# Patient Record
Sex: Male | Born: 1955 | Hispanic: Yes | Marital: Married | Smoking: Never smoker
Health system: Southern US, Community
[De-identification: ages and names within clinical notes are randomized; demographics above are authoritative.]

## PROBLEM LIST (undated history)

## (undated) DIAGNOSIS — E119 Type 2 diabetes mellitus without complications: Secondary | ICD-10-CM

## (undated) DIAGNOSIS — N200 Calculus of kidney: Secondary | ICD-10-CM

## (undated) DIAGNOSIS — Z87442 Personal history of urinary calculi: Secondary | ICD-10-CM

## (undated) HISTORY — PX: APPENDECTOMY: SHX54

## (undated) HISTORY — PX: COLONOSCOPY: SHX174

---

## 2012-07-09 ENCOUNTER — Ambulatory Visit
Admission: RE | Admit: 2012-07-09 | Discharge: 2012-07-09 | Disposition: A | Discharge status: Home / Self Care | Payer: BC Managed Care – PPO | Source: Ambulatory Visit | Attending: Internal Medicine | Admitting: Internal Medicine

## 2012-07-09 ENCOUNTER — Other Ambulatory Visit: Payer: Self-pay | Admitting: Internal Medicine

## 2012-07-09 DIAGNOSIS — R319 Hematuria, unspecified: Secondary | ICD-10-CM

## 2012-07-09 DIAGNOSIS — M545 Low back pain: Secondary | ICD-10-CM

## 2012-09-06 ENCOUNTER — Emergency Department (HOSPITAL_COMMUNITY)
Admission: EM | Admit: 2012-09-06 | Discharge: 2012-09-06 | Disposition: A | Discharge status: Home / Self Care | Payer: BC Managed Care – PPO | Attending: Emergency Medicine | Admitting: Emergency Medicine

## 2012-09-06 ENCOUNTER — Emergency Department (HOSPITAL_COMMUNITY): Payer: BC Managed Care – PPO

## 2012-09-06 ENCOUNTER — Encounter (HOSPITAL_COMMUNITY): Payer: Self-pay | Admitting: *Deleted

## 2012-09-06 DIAGNOSIS — R7309 Other abnormal glucose: Secondary | ICD-10-CM | POA: Insufficient documentation

## 2012-09-06 DIAGNOSIS — N200 Calculus of kidney: Secondary | ICD-10-CM | POA: Insufficient documentation

## 2012-09-06 DIAGNOSIS — Z79899 Other long term (current) drug therapy: Secondary | ICD-10-CM | POA: Insufficient documentation

## 2012-09-06 DIAGNOSIS — R739 Hyperglycemia, unspecified: Secondary | ICD-10-CM

## 2012-09-06 DIAGNOSIS — Z9089 Acquired absence of other organs: Secondary | ICD-10-CM | POA: Insufficient documentation

## 2012-09-06 HISTORY — DX: Calculus of kidney: N20.0

## 2012-09-06 LAB — URINALYSIS, ROUTINE W REFLEX MICROSCOPIC
Bilirubin Urine: NEGATIVE
Protein, ur: 100 mg/dL — AB
Urobilinogen, UA: 1 mg/dL (ref 0.0–1.0)

## 2012-09-06 LAB — POCT I-STAT, CHEM 8
Creatinine, Ser: 0.8 mg/dL (ref 0.50–1.35)
Hemoglobin: 18 g/dL — ABNORMAL HIGH (ref 13.0–17.0)
Potassium: 4.4 mEq/L (ref 3.5–5.1)
Sodium: 140 mEq/L (ref 135–145)
TCO2: 30 mmol/L (ref 0–100)

## 2012-09-06 MED ORDER — HYDROMORPHONE HCL PF 1 MG/ML IJ SOLN
1.0000 mg | Freq: Once | INTRAMUSCULAR | Status: AC
  Administered 2012-09-06: 1 mg via INTRAVENOUS
  Filled 2012-09-06: qty 1

## 2012-09-06 MED ORDER — OXYCODONE-ACETAMINOPHEN 5-325 MG PO TABS: 2.0000 | ORAL_TABLET | ORAL | Status: DC | PRN

## 2012-09-06 MED ORDER — KETOROLAC TROMETHAMINE 30 MG/ML IJ SOLN
30.0000 mg | Freq: Once | INTRAMUSCULAR | Status: AC
  Administered 2012-09-06: 30 mg via INTRAVENOUS
  Filled 2012-09-06: qty 1

## 2012-09-06 MED ORDER — SODIUM CHLORIDE 0.9 % IV BOLUS (SEPSIS)
1000.0000 mL | Freq: Once | INTRAVENOUS | Status: AC
  Administered 2012-09-06: 1000 mL via INTRAVENOUS

## 2012-09-06 MED ORDER — ONDANSETRON HCL 8 MG PO TABS: 8.0000 mg | ORAL_TABLET | Freq: Three times a day (TID) | ORAL | Status: DC | PRN

## 2012-09-06 MED ORDER — ONDANSETRON HCL 4 MG/2ML IJ SOLN
4.0000 mg | Freq: Once | INTRAMUSCULAR | Status: AC
  Administered 2012-09-06: 4 mg via INTRAVENOUS
  Filled 2012-09-06: qty 2

## 2012-09-06 NOTE — ED Notes (Signed)
Pt previous history of kidney stone that hasn't passed yet; pt c/o lower abd pain starting yesterday and right flank pain; worse today; nausea no vomiting

## 2012-09-06 NOTE — ED Provider Notes (Signed)
History     CSN: [id number]  Arrival date & time 09/06/12  1308   First MD Initiated Contact with Patient 09/06/12 2013      Chief Complaint  Patient presents with  . Flank Pain    (Consider location/radiation/quality/duration/timing/severity/associated sxs/prior treatment) HPI History provided by pt.   Pt presents w/ c/o severe, constant, non-pleuritic, suprapubic pain w/ radiation to right flank since early this afternoon.  No relief w/ vicodin and flomax.  Associated w/ nausea and dysuria.  Denies fever, SOB, testicular pain, change in bowels.  H/o kidney stone in December.  Seen by [facility].  Per prior chart, CT showed 6mm ureteral stone at L5.  Pt has not had pain associated w/ this stone in 1 month.  Past abd surgeries include appendectomy.  Past Medical History  Diagnosis Date  . Kidney stone     Past Surgical History  Procedure Date  . Appendectomy     No family history on file.  History  Substance Use Topics  . Smoking status: Never Smoker   . Smokeless tobacco: Not on file  . Alcohol Use: No      Review of Systems  All other systems reviewed and are negative.    Allergies  Adhesive  Home Medications   Current Outpatient Rx  Name  Route  Sig  Dispense  Refill  . ESOMEPRAZOLE MAGNESIUM 40 MG PO CPDR   Oral   Take 40 mg by mouth daily as needed. For acid reflux         . HYDROCODONE-ACETAMINOPHEN 10-325 MG PO TABS   Oral   Take 1 tablet by mouth every 6 (six) hours as needed. For pain         . TAMSULOSIN HCL 0.4 MG PO CAPS   Oral   Take 0.4 mg by mouth daily.           BP 159/87  Pulse 69  Temp 98.1 F (36.7 C) (Oral)  Resp 16  SpO2 100%  Physical Exam  Nursing note and vitals reviewed. Constitutional: He is oriented to person, place, and time. He appears well-developed and well-nourished. No distress.  HENT:  Head: Normocephalic and atraumatic.  Eyes:       Normal appearance  Neck: Normal range of motion.    Cardiovascular: Normal rate and regular rhythm.   Pulmonary/Chest: Effort normal and breath sounds normal. No respiratory distress.       No pleuritic pain reported  Abdominal: Soft. Bowel sounds are normal. He exhibits no distension and no mass. There is no rebound and no guarding.       Mod tenderness epigastrium, suprapubic and RLQ.    Genitourinary:       R CVA tenderness  Musculoskeletal: Normal range of motion.  Neurological: He is alert and oriented to person, place, and time.  Skin: Skin is warm and dry. No rash noted.  Psychiatric: He has a normal mood and affect. His behavior is normal.    ED Course  Procedures (including critical care time)  Labs Reviewed  URINALYSIS, ROUTINE W REFLEX MICROSCOPIC - Abnormal; Notable for the following:    Color, Urine AMBER (*)  BIOCHEMICALS MAY BE AFFECTED BY COLOR   APPearance TURBID (*)     Glucose, UA 250 (*)     Hgb urine dipstick LARGE (*)     Ketones, ur TRACE (*)     Protein, ur 100 (*)     All other components within normal limits  POCT I-STAT,  CHEM 8 - Abnormal; Notable for the following:    Glucose, Bld 203 (*)     Hemoglobin 18.0 (*)     HCT 53.0 (*)     All other components within normal limits  URINE MICROSCOPIC-ADD ON   Ct Abdomen Pelvis Wo Contrast  09/06/2012  *RADIOLOGY REPORT*  Clinical Data: Right flank and lower abdominal pain.  Nausea. History urinary tract stones.  CT ABDOMEN AND PELVIS WITHOUT CONTRAST  Technique:  Multidetector CT imaging of the abdomen and pelvis was performed following the standard protocol without intravenous contrast.  Comparison: CT abdomen and pelvis 07/09/2012.  Findings: Lung bases are clear.  No pleural or pericardial effusion.  The patient has extensive stranding about the right kidney and ureter and mild to moderate right hydronephrosis.  The previously identified stone in the mid right ureter has progressed and is now at the right ureterovesicle junction measuring 0.5 cm.  No other  urinary tract stones are identified. Low attenuating lesion in the lower pole of the left kidney consistent with a cyst is again identified.  The liver is low attenuating consistent with fatty infiltration. No focal liver lesion.  A few small gallstones are identified without CT evidence of cholecystitis.  The spleen, adrenal glands and pancreas appear normal. There are some colonic diverticula without diverticulitis.  The colon otherwise appears normal.  The stomach and small bowel are unremarkable.  No focal bony abnormality is identified.  IMPRESSION:  1.  Mid right ureteral stone seen on prior study has progressed and is now at the right UVJ.  There is mild to moderate right hydronephrosis with extensive stranding about the right kidney and ureter. 2.  Fatty infiltration of the liver. 3.  Gallstones without evidence of cholecystitis. 4.  Diverticulosis without diverticulitis.   Original Report Authenticated By: Holley Dexter, M.D.      1. Nephrolithiasis   2. Hyperglycemia       MDM  57yo healthy M w/ h/o kidney stone presents w/ severe suprapubic and right flank pain since early this afternoon.  Pain feels different than past stone.  On exam, afebrile, uncomfortable but non-toxic appearing, right CVA and suprapubic ttp.  Chem 8, U/A and CT abd/pelvis pending.  Pt to receive dilaudid, toradol, zofran and IVF.  Will reassess shortly.  9:04 PM   CT shows 6mm UVJ stone.  Cr w/in nml range and U/A neg for infection.  Results discussed w/ pt.  His pain and nausea are controlled and he is tolerating pos.  Prescribed percocet and zofran;  he has flomax at home.  He will f/u with his urologist.  Return precautions discussed. 11:03 PM         Otilio Miu, PA-C 09/06/12 2303

## 2012-09-07 NOTE — ED Provider Notes (Signed)
Medical screening examination/treatment/procedure(s) were performed by non-physician practitioner and as supervising physician I was immediately available for consultation/collaboration.  Tobin Chad, MD 09/07/12 0040

## 2013-11-19 IMAGING — CT CT ABD-PELV W/O CM
3 of 4 series · 13 of 36 positions shown, 19 images · non-contrast
Comparison: None

CLINICAL DATA: Lower back pain.  Hematuria.  Question of stones.
Prior appendectomy.

CT ABDOMEN AND PELVIS WITHOUT CONTRAST
TECHNIQUE: Multidetector CT imaging of the abdomen and pelvis was
performed following the standard protocol without intravenous
contrast.

[Series 3: renal stone · axial · 0.72mm/px · z∈[-339,-49]mm · 8 of 76 slices shown, 13 images]
[im 9/76  soft-tissue]
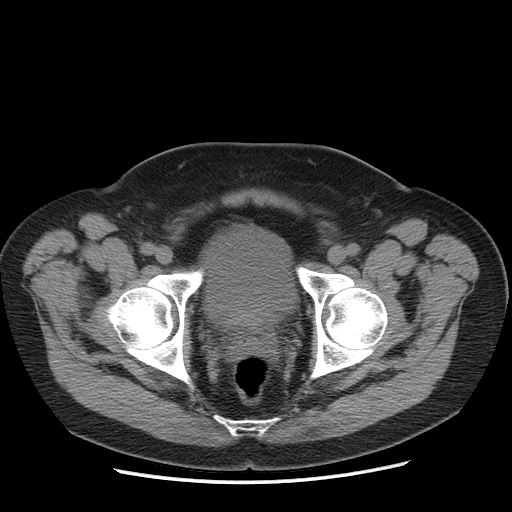
[im 9/76  bone]
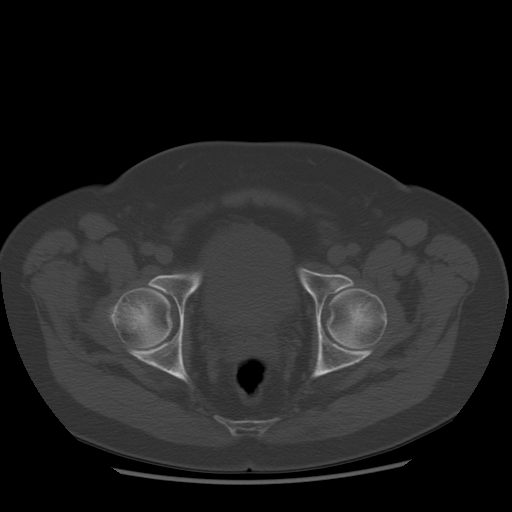
[im 17/76  soft-tissue]
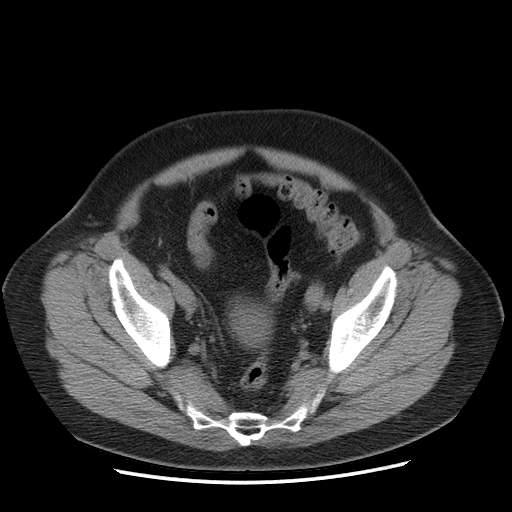
[im 26/76  soft-tissue]
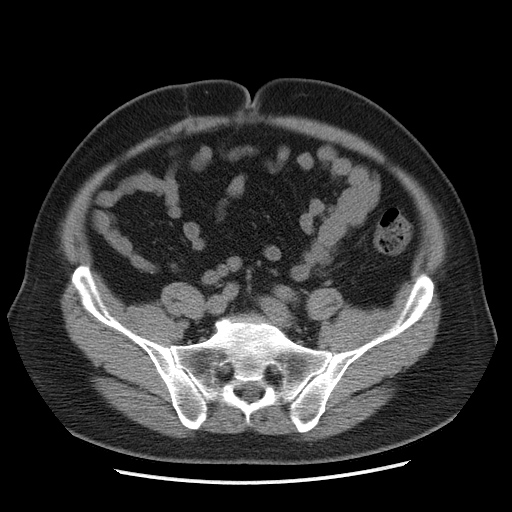
[im 34/76  soft-tissue]
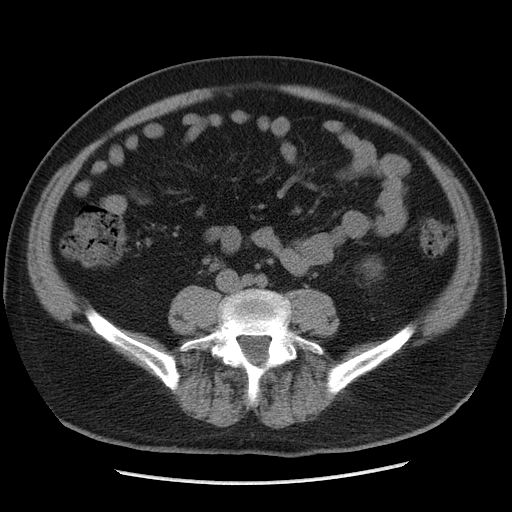
[im 42/76  soft-tissue]
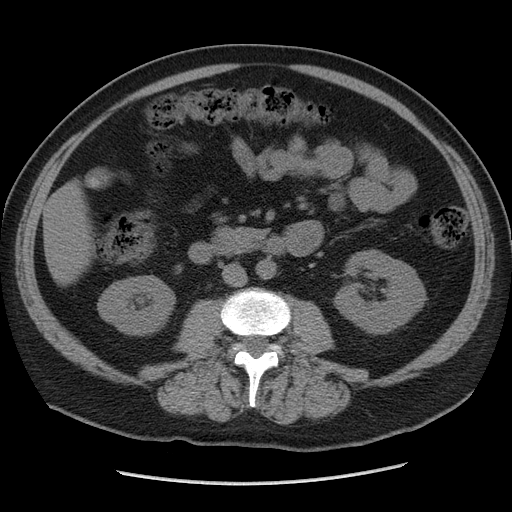
[im 42/76  lung]
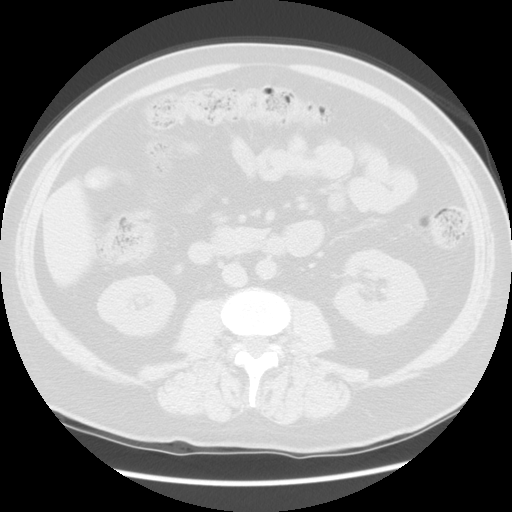
[im 51/76  soft-tissue]
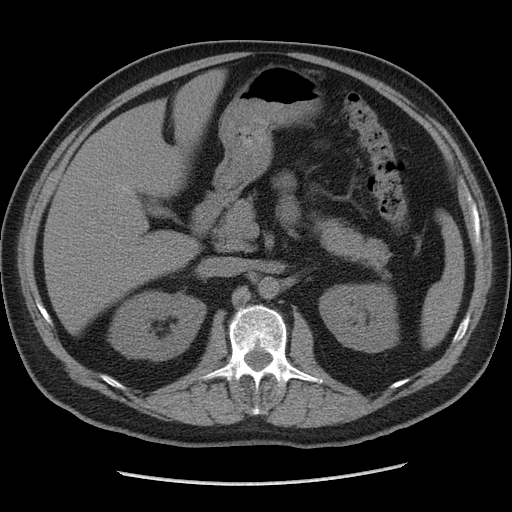
[im 51/76  lung]
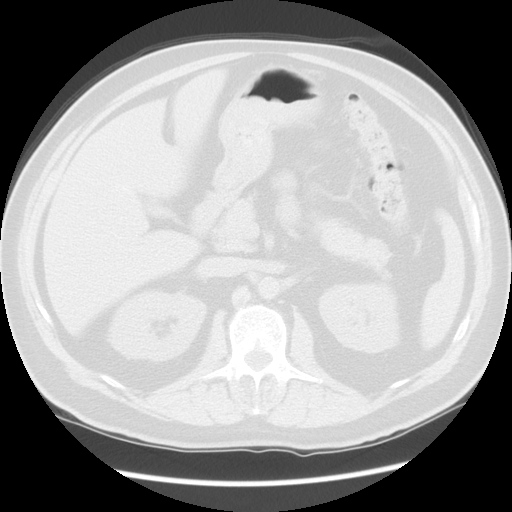
[im 59/76  soft-tissue]
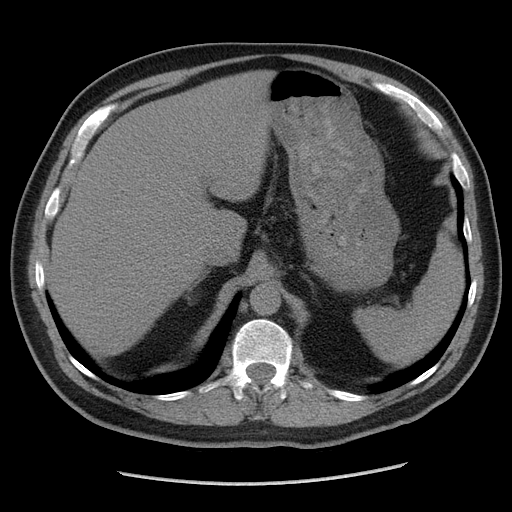
[im 59/76  lung]
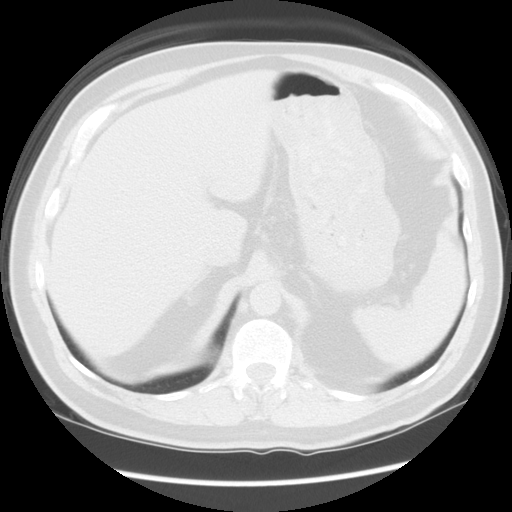
[im 67/76  soft-tissue]
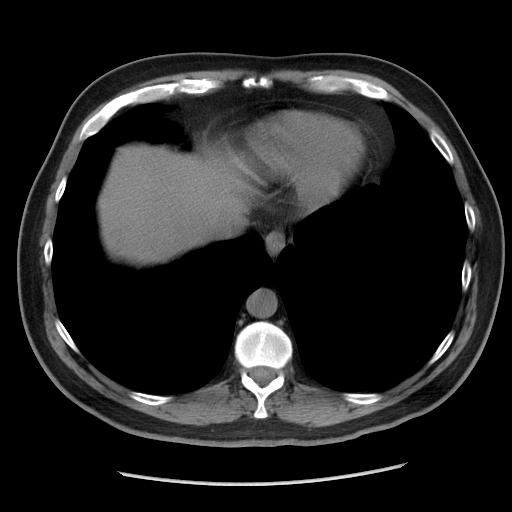
[im 67/76  lung]
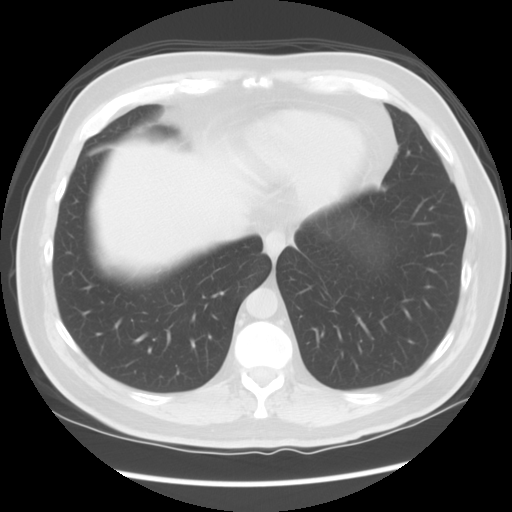

[Series 601: coronal body · coronal · 0.82mm/px · 1 of 127 slices shown, 2 images]
[im 43/127  soft-tissue]
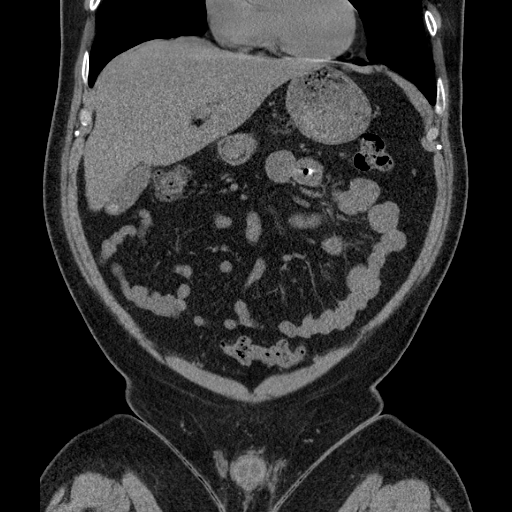
[im 43/127  bone]
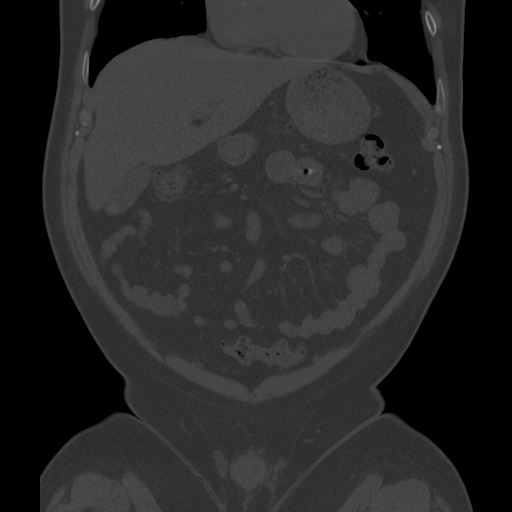

[Series 602: sagittal body · sagittal · 0.82mm/px · 4 of 149 slices shown]
[im 17/149  soft-tissue]
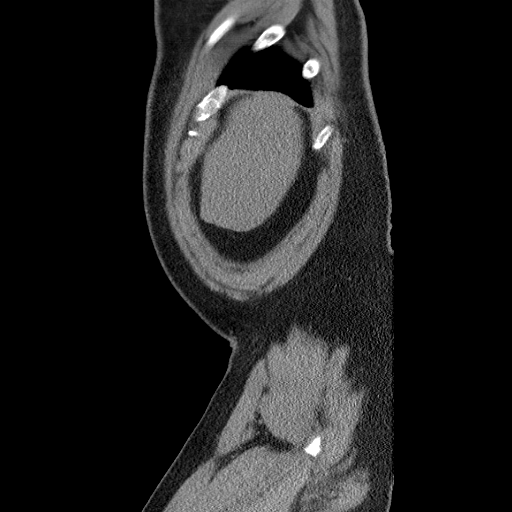
[im 33/149  soft-tissue]
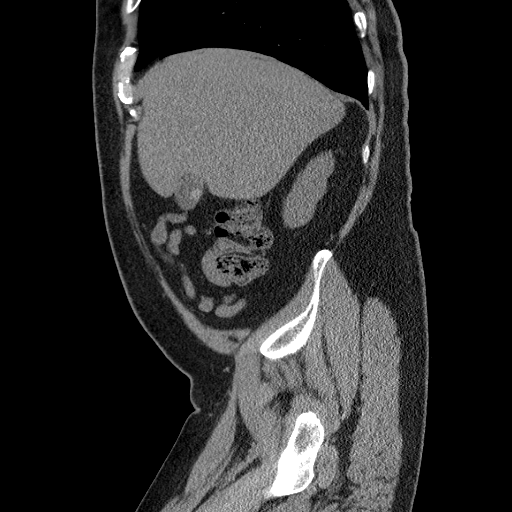
[im 50/149  soft-tissue]
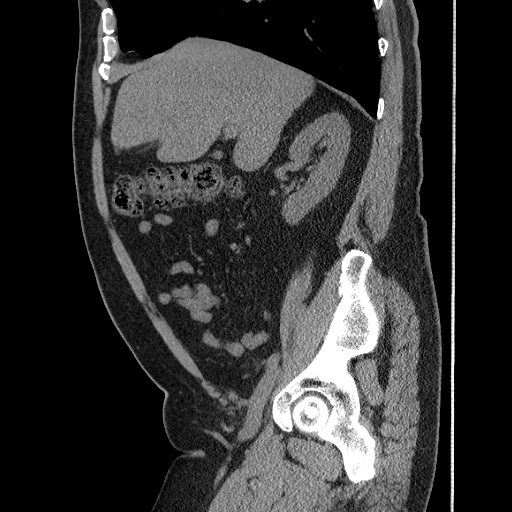
[im 66/149  soft-tissue]
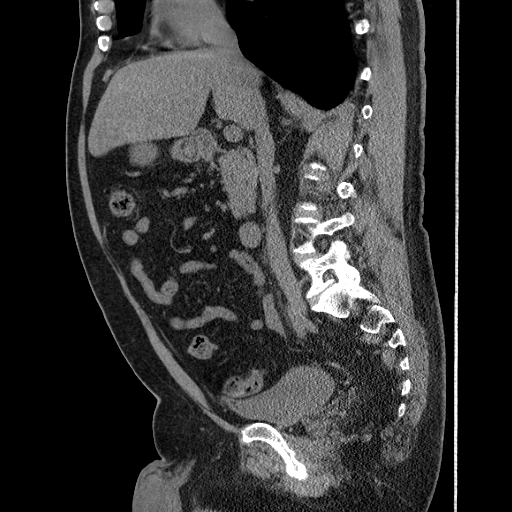

[13 of 36 positions shown; findings below may reference images not displayed]

FINDINGS: Images of the lung bases are unremarkable.  No focal
abnormality identified within the liver, spleen, pancreas, adrenal
glands, or right kidney.  A right proximal ureteral stone measures
6 mm, at the level of L5.  A lower pole lesion is identified in the
left kidney, measuring 2.5 cm.  This is low attenuation but not
fully characterized given the lack of intravenous contrast.
Statistically, this likely represents a cyst.  No left ureteral
stones are identified.

The gallbladder is present contains high attenuation foci,
consistent with stones.  No pericholecystic fluid or stranding
identified.

The stomach and small bowel loops are normal in appearance.  The
appendix is surgically absent.  Colonic loops are normal in
appearance.

No free pelvic fluid or pelvic adenopathy. No evidence for aortic
aneurysm. Visualized osseous structures have a normal appearance.
IMPRESSION: 1.  6 mm right ureteral stone, at the level of L5.
2.  Probable left renal cyst.
3.  Surgically absent appendix.

## 2016-10-31 ENCOUNTER — Ambulatory Visit (INDEPENDENT_AMBULATORY_CARE_PROVIDER_SITE_OTHER): Payer: Self-pay | Admitting: Orthopedic Surgery

## 2016-11-06 ENCOUNTER — Ambulatory Visit (INDEPENDENT_AMBULATORY_CARE_PROVIDER_SITE_OTHER): Payer: Self-pay | Admitting: Orthopedic Surgery

## 2016-11-07 ENCOUNTER — Ambulatory Visit (INDEPENDENT_AMBULATORY_CARE_PROVIDER_SITE_OTHER): Payer: Self-pay | Admitting: Orthopedic Surgery

## 2016-11-19 ENCOUNTER — Ambulatory Visit (INDEPENDENT_AMBULATORY_CARE_PROVIDER_SITE_OTHER): Payer: 59 | Admitting: Orthopedic Surgery

## 2016-11-19 ENCOUNTER — Encounter (INDEPENDENT_AMBULATORY_CARE_PROVIDER_SITE_OTHER): Payer: Self-pay | Admitting: Orthopedic Surgery

## 2016-11-19 DIAGNOSIS — M25511 Pain in right shoulder: Secondary | ICD-10-CM

## 2016-11-19 MED ORDER — TRAMADOL HCL 50 MG PO TABS
50.0000 mg | ORAL_TABLET | Freq: Two times a day (BID) | ORAL | 0 refills | Status: DC | PRN
Start: 2016-11-19 — End: 2021-01-25

## 2016-11-19 NOTE — Progress Notes (Signed)
Office Visit Note   Patient: Antonio Jackson           Date of Birth: Sep 21, 1955           MRN: [id number] Visit Date: 11/19/2016 Requested by: No referring provider defined for this encounter. PCP: Pcp Not In System  Subjective: Chief Complaint  Patient presents with  . Right Shoulder - Pain    HPI: Patient is a 61 year old with right shoulder pain.  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  Reports a burning pain around the deltoid.  It does wake him from sleep at night.  Getting worse over 3 months but it's has been presents for slightly longer.  Prednisone dosepak gave him temporary relief only.  He noticed some restriction of motion when he was swimming this summer.  He denies any neck symptoms.  Denies any history of injury.              ROS: All systems reviewed are negative as they relate to the chief complaint within the history of present illness.  Patient denies  fevers or chills.   Assessment & Plan: Visit Diagnoses:  1. Acute pain of right shoulder     Plan: Impression is right frozen shoulder.  He has fairly significant restriction of passive range of motion.  Radiographs not done today per patient request.  Discussed with him natural history of frozen shoulder.  He's going to try exercise tramadol to take at night as well as physical therapy.  We'll give him a shot in the shoulder in 6 weeks if he is no better.  I'll see him back at that time for repeat clinical assessment in clinical decision making regarding injection.  Follow-Up Instructions: Return in about 6 weeks (around 12/31/2016).   Orders:  No orders of the defined types were placed in this encounter.  No orders of the defined types were placed in this encounter.     Procedures: No procedures performed   Clinical Data: No additional findings.  Objective: Vital Signs: There were no vitals taken for this visit.  Physical Exam:   Constitutional: Patient appears  well-developed HEENT:  Head: Normocephalic Eyes:EOM are normal Neck: Normal range of motion Cardiovascular: Normal rate Pulmonary/chest: Effort normal Neurologic: Patient is alert Skin: Skin is warm Psychiatric: Patient has normal mood and affect    Ortho Exam: Orthopedic exam demonstrates full active and passive range of motion of the left shoulder.  On the right shoulder he has external rotation at 15 abduction to about 30 compared to 60 on the left.  Isolated [place] flexion less than 90 on the right 180 on the left.  Isolated glenohumeral abduction again is around 70 on the right-hand side 120 on the left.  Rotator cuff strength is intact on the right-hand side to isolated infraspinatus supraspinatus and subscap muscle testing.  No other masses lymph adenopathy or skin changes noted in the shoulder region  Specialty Comments:  No specialty comments available.  Imaging: No results found.   PMFS History: There are no active problems to display for this patient.  Past Medical History:  Diagnosis Date  . Kidney stone     No family history on file.  Past Surgical History:  Procedure Laterality Date  . APPENDECTOMY     Social History   Occupational History  . Not on file.   Social History Main Topics  . Smoking status: Never Smoker  . Smokeless tobacco: Never Used  . Alcohol use No  .  Drug use: Unknown  . Sexual activity: Not on file

## 2017-01-02 ENCOUNTER — Ambulatory Visit (INDEPENDENT_AMBULATORY_CARE_PROVIDER_SITE_OTHER): Payer: 59 | Admitting: Orthopedic Surgery

## 2017-01-19 ENCOUNTER — Encounter (INDEPENDENT_AMBULATORY_CARE_PROVIDER_SITE_OTHER): Payer: Self-pay | Admitting: Orthopedic Surgery

## 2017-01-19 ENCOUNTER — Ambulatory Visit (INDEPENDENT_AMBULATORY_CARE_PROVIDER_SITE_OTHER): Payer: 59 | Admitting: Orthopedic Surgery

## 2017-01-19 DIAGNOSIS — M25511 Pain in right shoulder: Secondary | ICD-10-CM | POA: Diagnosis not present

## 2017-01-19 NOTE — Progress Notes (Signed)
   Office Visit Note   Patient: Antonio Jackson           Date of Birth: July 27, 1956           MRN: [id number] Visit Date: 01/19/2017 Requested by: No referring provider defined for this encounter. PCP: System, Pcp Not In  Subjective: Chief Complaint  Patient presents with  . Right Shoulder - Follow-up    HPI: Patient is a 61 year old with right frozen shoulder.  Last seen 11/19/2016.  He was doing well but his mother became sick and he had to attend her.  [place]  He can sleep now and in general he is improving.  He is taking ibuprofen.              ROS: All systems reviewed are negative as they relate to the chief complaint within the history of present illness.  Patient denies  fevers or chills.   Assessment & Plan: Visit Diagnoses:  1. Right shoulder pain, unspecified chronicity     Plan: Impression is marginal improvement in right shoulder range of motion.  Plan is continue with home exercise program plus physical therapy 3 times a week for 4-6 weeks.  I think he will continue to improve.  Decision point today was for or against injection.  He wants to hold off on an injection and continue to try to improve on his own.  I'll see him back as needed  Follow-Up Instructions: Return if symptoms worsen or fail to improve.   Orders:  No orders of the defined types were placed in this encounter.  No orders of the defined types were placed in this encounter.     Procedures: No procedures performed   Clinical Data: No additional findings.  Objective: Vital Signs: There were no vitals taken for this visit.  Physical Exam:   Constitutional: Patient appears well-developed HEENT:  Head: Normocephalic Eyes:EOM are normal Neck: Normal range of motion Cardiovascular: Normal rate Pulmonary/chest: Effort normal Neurologic: Patient is alert Skin: Skin is warm Psychiatric: Patient has normal mood and affect    Ortho Exam: Orthopedic exam  demonstrates good cervical spine range of motion good motor sensory function in the right arm external rotation on the right at 15 abduction is 20.  Isolated glenohumeral forward flexion is 100 and isolated glenohumeral abduction is 80.  Rotator cuff strength is intact.  The shoulder does feel more limber that it was 6 weeks ago.  Specialty Comments:  No specialty comments available.  Imaging: No results found.   PMFS History: There are no active problems to display for this patient.  Past Medical History:  Diagnosis Date  . Kidney stone     No family history on file.  Past Surgical History:  Procedure Laterality Date  . APPENDECTOMY     Social History   Occupational History  . Not on file.   Social History Main Topics  . Smoking status: Never Smoker  . Smokeless tobacco: Never Used  . Alcohol use No  . Drug use: Unknown  . Sexual activity: Not on file

## 2018-10-26 ENCOUNTER — Other Ambulatory Visit: Payer: Self-pay | Admitting: Internal Medicine

## 2018-10-26 DIAGNOSIS — R10816 Epigastric abdominal tenderness: Secondary | ICD-10-CM

## 2018-10-27 ENCOUNTER — Other Ambulatory Visit: Payer: Self-pay | Admitting: Internal Medicine

## 2018-10-27 ENCOUNTER — Other Ambulatory Visit: Payer: 59

## 2018-10-27 DIAGNOSIS — R10816 Epigastric abdominal tenderness: Secondary | ICD-10-CM

## 2018-11-01 ENCOUNTER — Other Ambulatory Visit: Payer: Self-pay

## 2018-11-08 ENCOUNTER — Ambulatory Visit
Admission: RE | Admit: 2018-11-08 | Discharge: 2018-11-08 | Disposition: A | Discharge status: Home / Self Care | Payer: 59 | Source: Ambulatory Visit | Attending: Internal Medicine | Admitting: Internal Medicine

## 2018-11-08 ENCOUNTER — Other Ambulatory Visit: Payer: 59

## 2018-11-08 ENCOUNTER — Other Ambulatory Visit: Payer: Self-pay

## 2018-11-08 DIAGNOSIS — R10816 Epigastric abdominal tenderness: Secondary | ICD-10-CM

## 2018-11-08 MED ORDER — IOPAMIDOL (ISOVUE-300) INJECTION 61%
100.0000 mL | Freq: Once | INTRAVENOUS | Status: AC | PRN
  Administered 2018-11-08: 100 mL via INTRAVENOUS

## 2019-10-09 ENCOUNTER — Ambulatory Visit: Payer: 59

## 2019-10-26 ENCOUNTER — Ambulatory Visit: Payer: 59

## 2020-04-10 ENCOUNTER — Other Ambulatory Visit: Payer: Self-pay | Admitting: Internal Medicine

## 2020-04-10 DIAGNOSIS — R1013 Epigastric pain: Secondary | ICD-10-CM

## 2020-04-20 ENCOUNTER — Ambulatory Visit
Admission: RE | Admit: 2020-04-20 | Discharge: 2020-04-20 | Disposition: A | Discharge status: Home / Self Care | Payer: 59 | Source: Ambulatory Visit | Attending: Internal Medicine | Admitting: Internal Medicine

## 2020-04-20 DIAGNOSIS — R1013 Epigastric pain: Secondary | ICD-10-CM

## 2020-06-22 ENCOUNTER — Other Ambulatory Visit: Payer: Self-pay | Admitting: Surgery

## 2020-08-29 ENCOUNTER — Encounter (HOSPITAL_BASED_OUTPATIENT_CLINIC_OR_DEPARTMENT_OTHER): Payer: Self-pay | Admitting: Surgery

## 2020-09-03 ENCOUNTER — Encounter (HOSPITAL_BASED_OUTPATIENT_CLINIC_OR_DEPARTMENT_OTHER)
Admission: RE | Admit: 2020-09-03 | Discharge: 2020-09-03 | Disposition: A | Discharge status: Home / Self Care | Payer: 59 | Source: Ambulatory Visit | Attending: Surgery | Admitting: Surgery

## 2020-09-03 ENCOUNTER — Other Ambulatory Visit (HOSPITAL_COMMUNITY)
Admission: RE | Admit: 2020-09-03 | Discharge: 2020-09-03 | Disposition: A | Discharge status: Home / Self Care | Payer: 59 | Source: Ambulatory Visit | Attending: Surgery | Admitting: Surgery

## 2020-09-03 DIAGNOSIS — Z20822 Contact with and (suspected) exposure to covid-19: Secondary | ICD-10-CM | POA: Insufficient documentation

## 2020-09-03 DIAGNOSIS — Z01818 Encounter for other preprocedural examination: Secondary | ICD-10-CM | POA: Insufficient documentation

## 2020-09-03 LAB — BASIC METABOLIC PANEL
Anion gap: 10 (ref 5–15)
BUN: 8 mg/dL (ref 8–23)
CO2: 24 mmol/L (ref 22–32)
Calcium: 9.2 mg/dL (ref 8.9–10.3)
Chloride: 103 mmol/L (ref 98–111)
Creatinine, Ser: 0.69 mg/dL (ref 0.61–1.24)
GFR, Estimated: >60 mL/min (ref >60)
Glucose, Bld: 161 mg/dL — ABNORMAL HIGH (ref 70–99)
Potassium: 3.8 mmol/L (ref 3.5–5.1)
Sodium: 137 mmol/L (ref 135–145)

## 2020-09-03 MED ORDER — ENSURE PRE-SURGERY PO LIQD: 296.0000 mL | Freq: Once | ORAL | Status: DC

## 2020-09-03 NOTE — Progress Notes (Signed)
EKG reviewed by Dr.Green, will proceed with surgery as scheduled. 

## 2020-09-03 NOTE — Progress Notes (Signed)

## 2020-09-04 LAB — SARS CORONAVIRUS 2 (TAT 6-24 HRS): SARS Coronavirus 2: NEGATIVE

## 2020-09-05 NOTE — Anesthesia Preprocedure Evaluation (Addendum)
Anesthesia Evaluation  Patient identified by MRN, date of birth, ID band Patient awake    Reviewed: Allergy & Precautions, H&P , NPO status , Patient's Chart, lab work & pertinent test results  Airway Mallampati: II  TM Distance: >3 FB Neck ROM: Full    Dental no notable dental hx. (+) Teeth Intact, Dental Advisory Given, Missing   Pulmonary neg pulmonary ROS,    Pulmonary exam normal breath sounds clear to auscultation       Cardiovascular Exercise Tolerance: Good negative cardio ROS Normal cardiovascular exam Rhythm:Regular Rate:Normal     Neuro/Psych negative neurological ROS  negative psych ROS   GI/Hepatic negative GI ROS, Neg liver ROS,   Endo/Other  diabetes, Oral Hypoglycemic Agents  Renal/GU negative Renal ROS  negative genitourinary   Musculoskeletal negative musculoskeletal ROS (+)   Abdominal   Peds negative pediatric ROS (+)  Hematology negative hematology ROS (+)   Anesthesia Other Findings   Reproductive/Obstetrics negative OB ROS                           Anesthesia Physical Anesthesia Plan  ASA: III  Anesthesia Plan: General   Post-op Pain Management:    Induction: Intravenous  PONV Risk Score and Plan: 2 and Ondansetron, Treatment may vary due to age or medical condition and Midazolam  Airway Management Planned: Oral ETT  Additional Equipment: None  Intra-op Plan:   Post-operative Plan: Extubation in OR  Informed Consent: I have reviewed the patients History and Physical, chart, labs and discussed the procedure including the risks, benefits and alternatives for the proposed anesthesia with the patient or authorized representative who has indicated his/her understanding and acceptance.       Plan Discussed with: Anesthesiologist and CRNA  Anesthesia Plan Comments: (  )      Anesthesia Quick Evaluation

## 2020-09-05 NOTE — H&P (Signed)
Antonio Jackson  Location: [facility] Patient #: [id number] DOB: 1955-12-21 Married / Language: Undefined / Race: White Male   History of Present Illness  The patient is a 65 year old male who presents for evaluation of gall stones.  Chief complaint: Epigastric abdominal pain  This is a pleasant 65 year old gentleman referred by Dr. Loreta Ave evaluation of symptomatic gallstones. For years, he has been having attacks of epigastric abdominal pain and bloating hurting due to his back. This is worse after fatty meals. The pain can be moderate at times. He describes as sharp. He denies jaundice. He has had no emesis. Bowel movements are normal. He is otherwise healthy without complaints. He underwent an ultrasound showing gallstones with the largest being 1.1 cm.   Past Surgical History (Tanisha A. Manson Passey, RMA;  Appendectomy   Allergies (Tanisha A. Manson Passey, RMA Adhesive Tape  Allergies Reconciled   Medication History (Tanisha A. Manson Passey, RMA;  Pantoprazole Sodium (40MG  Tablet DR, Oral) Active. Trulicity (1.5MG /0.5ML Soln Pen-inj, Subcutaneous) Active. Medications Reconciled  Social History (Tanisha A. , RMA; Alcohol use  Occasional alcohol use. Caffeine use  Tea. No drug use  Tobacco use  Current some day smoker.  Family History (Tanisha A. Manson Passey, RMA;  Cerebrovascular Accident  Mother. Hypertension  Mother.  Other Problems (Tanisha A. Manson Passey, RMA;  Back Pain  Cholelithiasis  Diabetes Mellitus  Hypercholesterolemia  Kidney Stone     Review of Systems (Tanisha A. Manson Passey RMA;  General Not Present- Appetite Loss, Chills, Fatigue, Fever, Night Sweats, Weight Gain and Weight Loss. Skin Present- Dryness. Not Present- Change in Wart/Mole, Hives, Jaundice, New Lesions, Non-Healing Wounds, Rash and Ulcer. HEENT Present- Hearing Loss and Wears glasses/contact lenses. Not Present- Earache, Hoarseness, Nose Bleed, Oral Ulcers, Ringing in the Ears,  Seasonal Allergies, Sinus Pain, Sore Throat, Visual Disturbances and Yellow Eyes. Respiratory Present- Snoring. Not Present- Bloody sputum, Chronic Cough, Difficulty Breathing and Wheezing. Cardiovascular Present- Leg Cramps. Not Present- Chest Pain, Difficulty Breathing Lying Down, Palpitations, Rapid Heart Rate, Shortness of Breath and Swelling of Extremities. Gastrointestinal Present- Abdominal Pain. Not Present- Bloating, Bloody Stool, Change in Bowel Habits, Chronic diarrhea, Constipation, Difficulty Swallowing, Excessive gas, Gets full quickly at meals, Hemorrhoids, Indigestion, Nausea, Rectal Pain and Vomiting. Neurological Not Present- Decreased Memory, Fainting, Headaches, Numbness, Seizures, Tingling, Tremor, Trouble walking and Weakness.  Vitals  Weight: 161.4 lb Height: 63in Body Surface Area: 1.77 m Body Mass Index: 28.59 kg/m  Temp.: 98.23F  Pulse: 90 (Regular)  BP: 126/74(Sitting, Left Arm, Standard)     Physical Exam  The physical exam findings are as follows: Note: He appears well today  His abdomen is soft. There is no hepatomegaly. There is very mild tenderness in the epigastrium to deep palpation. There are no masses.    Assessment & Plan  SYMPTOMATIC CHOLELITHIASIS (K80.20)  Impression: I reviewed the patient's notes from Dr. Manson Passey as well as the notes in the electronic medical records. I have reviewed his ultrasound and laboratory data. He does have symptomatic cholelithiasis. I discussed the diagnosis with the patient and his son in detail. We discussed reasons for removing the gallbladder to prevent further attacks and other complications. I discussed the surgical procedure in detail. I gave him literature regarding this. I discussed the procedure in detail. We discussed the risks and benefits of a laparoscopic cholecystectomy and possible cholangiogram including, but not limited to, bleeding, infection, injury to surrounding structures such as the  intestine or liver, bile leak, retained gallstones, need to convert to an open  procedure, prolonged diarrhea, blood clots such as DVT, common bile duct injury, anesthesia risks, and possible need for additional procedures. The likelihood of improvement in symptoms and return to the patient's normal status is good. We discussed the typical post-operative recovery course. All questions were answered.

## 2020-09-06 ENCOUNTER — Ambulatory Visit (HOSPITAL_BASED_OUTPATIENT_CLINIC_OR_DEPARTMENT_OTHER): Payer: 59 | Admitting: Anesthesiology

## 2020-09-06 ENCOUNTER — Encounter (HOSPITAL_BASED_OUTPATIENT_CLINIC_OR_DEPARTMENT_OTHER)
Admission: RE | Disposition: A | Discharge status: Home / Self Care | Payer: Self-pay | Source: Home / Self Care | Attending: Surgery

## 2020-09-06 ENCOUNTER — Other Ambulatory Visit: Payer: Self-pay

## 2020-09-06 ENCOUNTER — Ambulatory Visit (HOSPITAL_BASED_OUTPATIENT_CLINIC_OR_DEPARTMENT_OTHER)
Admission: RE | Admit: 2020-09-06 | Discharge: 2020-09-06 | Disposition: A | Discharge status: Home / Self Care | Payer: 59 | Attending: Surgery | Admitting: Surgery

## 2020-09-06 ENCOUNTER — Encounter (HOSPITAL_BASED_OUTPATIENT_CLINIC_OR_DEPARTMENT_OTHER): Payer: Self-pay | Admitting: Surgery

## 2020-09-06 DIAGNOSIS — K8020 Calculus of gallbladder without cholecystitis without obstruction: Secondary | ICD-10-CM | POA: Diagnosis present

## 2020-09-06 DIAGNOSIS — K8010 Calculus of gallbladder with chronic cholecystitis without obstruction: Secondary | ICD-10-CM | POA: Insufficient documentation

## 2020-09-06 HISTORY — DX: Type 2 diabetes mellitus without complications: E11.9

## 2020-09-06 HISTORY — PX: CHOLECYSTECTOMY: SHX55

## 2020-09-06 HISTORY — DX: Personal history of urinary calculi: Z87.442

## 2020-09-06 LAB — GLUCOSE, CAPILLARY
Glucose-Capillary: 130 mg/dL — ABNORMAL HIGH (ref 70–99)
Glucose-Capillary: 154 mg/dL — ABNORMAL HIGH (ref 70–99)

## 2020-09-06 SURGERY — LAPAROSCOPIC CHOLECYSTECTOMY
Anesthesia: General | Site: Abdomen

## 2020-09-06 MED ORDER — MIDAZOLAM HCL 5 MG/5ML IJ SOLN
INTRAMUSCULAR | Status: DC | PRN
  Administered 2020-09-06: 2 mg via INTRAVENOUS

## 2020-09-06 MED ORDER — LACTATED RINGERS IV SOLN: INTRAVENOUS | Status: DC

## 2020-09-06 MED ORDER — GABAPENTIN 300 MG PO CAPS
ORAL_CAPSULE | ORAL | Status: AC
  Filled 2020-09-06: qty 1

## 2020-09-06 MED ORDER — SUGAMMADEX SODIUM 500 MG/5ML IV SOLN
INTRAVENOUS | Status: DC | PRN
  Administered 2020-09-06: 300 mg via INTRAVENOUS

## 2020-09-06 MED ORDER — CHLORHEXIDINE GLUCONATE CLOTH 2 % EX PADS: 6.0000 | MEDICATED_PAD | Freq: Once | CUTANEOUS | Status: DC

## 2020-09-06 MED ORDER — ROCURONIUM BROMIDE 100 MG/10ML IV SOLN
INTRAVENOUS | Status: DC | PRN
  Administered 2020-09-06: 40 mg via INTRAVENOUS

## 2020-09-06 MED ORDER — DEXAMETHASONE SODIUM PHOSPHATE 4 MG/ML IJ SOLN
INTRAMUSCULAR | Status: DC | PRN
  Administered 2020-09-06: 10 mg via INTRAVENOUS

## 2020-09-06 MED ORDER — PROPOFOL 500 MG/50ML IV EMUL
INTRAVENOUS | Status: AC
  Filled 2020-09-06: qty 50

## 2020-09-06 MED ORDER — PHENYLEPHRINE 40 MCG/ML (10ML) SYRINGE FOR IV PUSH (FOR BLOOD PRESSURE SUPPORT)
PREFILLED_SYRINGE | INTRAVENOUS | Status: AC
  Filled 2020-09-06: qty 10

## 2020-09-06 MED ORDER — EPHEDRINE 5 MG/ML INJ
INTRAVENOUS | Status: AC
  Filled 2020-09-06: qty 10

## 2020-09-06 MED ORDER — ONDANSETRON HCL 4 MG/2ML IJ SOLN: 4.0000 mg | Freq: Once | INTRAMUSCULAR | Status: DC | PRN

## 2020-09-06 MED ORDER — SODIUM CHLORIDE (PF) 0.9 % IJ SOLN
INTRAMUSCULAR | Status: AC
  Filled 2020-09-06: qty 10

## 2020-09-06 MED ORDER — ACETAMINOPHEN 500 MG PO TABS
ORAL_TABLET | ORAL | Status: AC
  Filled 2020-09-06: qty 2

## 2020-09-06 MED ORDER — DIPHENHYDRAMINE HCL 50 MG/ML IJ SOLN
INTRAMUSCULAR | Status: DC | PRN
  Administered 2020-09-06: 6.25 mg via INTRAVENOUS

## 2020-09-06 MED ORDER — FENTANYL CITRATE (PF) 100 MCG/2ML IJ SOLN
INTRAMUSCULAR | Status: AC
  Filled 2020-09-06: qty 2

## 2020-09-06 MED ORDER — MEPERIDINE HCL 25 MG/ML IJ SOLN: 6.2500 mg | INTRAMUSCULAR | Status: DC | PRN

## 2020-09-06 MED ORDER — ACETAMINOPHEN 160 MG/5ML PO SOLN: 325.0000 mg | ORAL | Status: DC | PRN

## 2020-09-06 MED ORDER — PROPOFOL 10 MG/ML IV BOLUS
INTRAVENOUS | Status: DC | PRN
  Administered 2020-09-06: 200 mg via INTRAVENOUS

## 2020-09-06 MED ORDER — SODIUM CHLORIDE 0.9 % IR SOLN
Status: DC | PRN
  Administered 2020-09-06: 500 mL

## 2020-09-06 MED ORDER — LIDOCAINE 2% (20 MG/ML) 5 ML SYRINGE
INTRAMUSCULAR | Status: AC
  Filled 2020-09-06: qty 5

## 2020-09-06 MED ORDER — BUPIVACAINE-EPINEPHRINE (PF) 0.5% -1:200000 IJ SOLN
INTRAMUSCULAR | Status: AC
  Filled 2020-09-06: qty 30

## 2020-09-06 MED ORDER — FENTANYL CITRATE (PF) 100 MCG/2ML IJ SOLN
INTRAMUSCULAR | Status: DC | PRN
  Administered 2020-09-06: 100 ug via INTRAVENOUS

## 2020-09-06 MED ORDER — ONDANSETRON HCL 4 MG/2ML IJ SOLN
INTRAMUSCULAR | Status: DC | PRN
  Administered 2020-09-06: 4 mg via INTRAVENOUS

## 2020-09-06 MED ORDER — GABAPENTIN 300 MG PO CAPS
300.0000 mg | ORAL_CAPSULE | ORAL | Status: AC
  Administered 2020-09-06: 300 mg via ORAL

## 2020-09-06 MED ORDER — DIPHENHYDRAMINE HCL 50 MG/ML IJ SOLN
INTRAMUSCULAR | Status: AC
  Filled 2020-09-06: qty 1

## 2020-09-06 MED ORDER — SUGAMMADEX SODIUM 500 MG/5ML IV SOLN
INTRAVENOUS | Status: AC
  Filled 2020-09-06: qty 5

## 2020-09-06 MED ORDER — SUCCINYLCHOLINE CHLORIDE 200 MG/10ML IV SOSY
PREFILLED_SYRINGE | INTRAVENOUS | Status: AC
  Filled 2020-09-06: qty 10

## 2020-09-06 MED ORDER — ACETAMINOPHEN 500 MG PO TABS
1000.0000 mg | ORAL_TABLET | ORAL | Status: AC
  Administered 2020-09-06: 1000 mg via ORAL

## 2020-09-06 MED ORDER — BUPIVACAINE-EPINEPHRINE (PF) 0.5% -1:200000 IJ SOLN
INTRAMUSCULAR | Status: DC | PRN
  Administered 2020-09-06: 20 mL

## 2020-09-06 MED ORDER — LIDOCAINE HCL (CARDIAC) PF 100 MG/5ML IV SOSY
PREFILLED_SYRINGE | INTRAVENOUS | Status: DC | PRN
  Administered 2020-09-06: 100 mg via INTRAVENOUS

## 2020-09-06 MED ORDER — DEXAMETHASONE SODIUM PHOSPHATE 10 MG/ML IJ SOLN
INTRAMUSCULAR | Status: AC
  Filled 2020-09-06: qty 1

## 2020-09-06 MED ORDER — MIDAZOLAM HCL 2 MG/2ML IJ SOLN
INTRAMUSCULAR | Status: AC
  Filled 2020-09-06: qty 2

## 2020-09-06 MED ORDER — FENTANYL CITRATE (PF) 100 MCG/2ML IJ SOLN
25.0000 ug | INTRAMUSCULAR | Status: DC | PRN
  Administered 2020-09-06: 50 ug via INTRAVENOUS
  Administered 2020-09-06: 25 ug via INTRAVENOUS

## 2020-09-06 MED ORDER — CEFAZOLIN SODIUM-DEXTROSE 2-4 GM/100ML-% IV SOLN
2.0000 g | INTRAVENOUS | Status: AC
  Administered 2020-09-06: 2 g via INTRAVENOUS

## 2020-09-06 MED ORDER — OXYCODONE HCL 5 MG PO TABS
5.0000 mg | ORAL_TABLET | Freq: Once | ORAL | Status: AC | PRN
  Administered 2020-09-06: 5 mg via ORAL

## 2020-09-06 MED ORDER — CEFAZOLIN SODIUM-DEXTROSE 2-4 GM/100ML-% IV SOLN
INTRAVENOUS | Status: AC
  Filled 2020-09-06: qty 100

## 2020-09-06 MED ORDER — OXYCODONE HCL 5 MG PO TABS
ORAL_TABLET | ORAL | Status: AC
  Filled 2020-09-06: qty 1

## 2020-09-06 MED ORDER — METHYLENE BLUE 0.5 % INJ SOLN
INTRAVENOUS | Status: AC
  Filled 2020-09-06: qty 10

## 2020-09-06 MED ORDER — OXYCODONE HCL 5 MG PO TABS: 5.0000 mg | ORAL_TABLET | Freq: Four times a day (QID) | ORAL | 0 refills | Status: DC | PRN

## 2020-09-06 MED ORDER — OXYCODONE HCL 5 MG/5ML PO SOLN: 5.0000 mg | Freq: Once | ORAL | Status: AC | PRN

## 2020-09-06 MED ORDER — ACETAMINOPHEN 325 MG PO TABS: 325.0000 mg | ORAL_TABLET | ORAL | Status: DC | PRN

## 2020-09-06 MED ORDER — ONDANSETRON HCL 4 MG/2ML IJ SOLN
INTRAMUSCULAR | Status: AC
  Filled 2020-09-06: qty 2

## 2020-09-06 MED ORDER — BUPIVACAINE-EPINEPHRINE (PF) 0.25% -1:200000 IJ SOLN
INTRAMUSCULAR | Status: AC
  Filled 2020-09-06: qty 30

## 2020-09-06 SURGICAL SUPPLY — 38 items
APPLIER CLIP 5 13 M/L LIGAMAX5 (MISCELLANEOUS) ×2
BLADE CLIPPER SURG (BLADE) ×2 IMPLANT
CHLORAPREP W/TINT 26 (MISCELLANEOUS) ×2 IMPLANT
CLIP APPLIE 5 13 M/L LIGAMAX5 (MISCELLANEOUS) ×1 IMPLANT
COVER MAYO STAND STRL (DRAPES) IMPLANT
COVER WAND RF STERILE (DRAPES) IMPLANT
DECANTER SPIKE VIAL GLASS SM (MISCELLANEOUS) IMPLANT
DERMABOND ADVANCED (GAUZE/BANDAGES/DRESSINGS) ×1
DERMABOND ADVANCED .7 DNX12 (GAUZE/BANDAGES/DRESSINGS) ×1 IMPLANT
DRAPE C-ARM 42X72 X-RAY (DRAPES) IMPLANT
DRAPE LAPAROSCOPIC ABDOMINAL (DRAPES) ×2 IMPLANT
ELECT REM PT RETURN 9FT ADLT (ELECTROSURGICAL) ×2
ELECTRODE REM PT RTRN 9FT ADLT (ELECTROSURGICAL) ×1 IMPLANT
GLOVE BIOGEL PI IND STRL 7.0 (GLOVE) ×2 IMPLANT
GLOVE BIOGEL PI INDICATOR 7.0 (GLOVE) ×2
GLOVE SURG SIGNA 7.5 PF LTX (GLOVE) ×2 IMPLANT
GLOVE SURG SS PI 7.0 STRL IVOR (GLOVE) ×2 IMPLANT
GLOVE SURG SS PI 8.0 STRL IVOR (GLOVE) ×2 IMPLANT
GOWN STRL REUS W/ TWL LRG LVL3 (GOWN DISPOSABLE) ×2 IMPLANT
GOWN STRL REUS W/ TWL XL LVL3 (GOWN DISPOSABLE) ×1 IMPLANT
GOWN STRL REUS W/TWL LRG LVL3 (GOWN DISPOSABLE) ×2
GOWN STRL REUS W/TWL XL LVL3 (GOWN DISPOSABLE) ×1
NS IRRIG 1000ML POUR BTL (IV SOLUTION) ×2 IMPLANT
PACK BASIN DAY SURGERY FS (CUSTOM PROCEDURE TRAY) ×2 IMPLANT
POUCH SPECIMEN RETRIEVAL 10MM (ENDOMECHANICALS) ×2 IMPLANT
SCISSORS LAP 5X35 DISP (ENDOMECHANICALS) ×2 IMPLANT
SET CHOLANGIOGRAPH 5 50 .035 (SET/KITS/TRAYS/PACK) IMPLANT
SET IRRIG TUBING LAPAROSCOPIC (IRRIGATION / IRRIGATOR) ×2 IMPLANT
SET TUBE SMOKE EVAC HIGH FLOW (TUBING) ×2 IMPLANT
SLEEVE ENDOPATH XCEL 5M (ENDOMECHANICALS) ×4 IMPLANT
SLEEVE SCD COMPRESS KNEE MED (MISCELLANEOUS) ×2 IMPLANT
SPECIMEN JAR SMALL (MISCELLANEOUS) ×2 IMPLANT
SUT MON AB 4-0 PC3 18 (SUTURE) ×2 IMPLANT
TOWEL GREEN STERILE FF (TOWEL DISPOSABLE) ×2 IMPLANT
TRAY LAPAROSCOPIC (CUSTOM PROCEDURE TRAY) ×2 IMPLANT
TROCAR XCEL BLUNT TIP 100MML (ENDOMECHANICALS) ×2 IMPLANT
TROCAR XCEL NON-BLD 5MMX100MML (ENDOMECHANICALS) ×2 IMPLANT
TUBE CONNECTING 20X1/4 (TUBING) ×2 IMPLANT

## 2020-09-06 NOTE — Transfer of Care (Signed)
Immediate Anesthesia Transfer of Care Note  Patient: Verlie Hellenbrand  Procedure(s) Performed: LAPAROSCOPIC CHOLECYSTECTOMY (N/A Abdomen)  Patient Location: PACU  Anesthesia Type:General  Level of Consciousness: sedated  Airway & Oxygen Therapy: Patient Spontanous Breathing and Patient connected to face mask oxygen  Post-op Assessment: Report given to RN and Post -op Vital signs reviewed and stable  Post vital signs: Reviewed and stable  Last Vitals:  Vitals Value Taken Time  BP    Temp    Pulse 89 09/06/20 0816  Resp 10 09/06/20 0816  SpO2 100 % 09/06/20 0816  Vitals shown include unvalidated device data.  Last Pain:  Vitals:   09/06/20 0630  PainSc: 0-No pain      Patients Stated Pain Goal: 6 (09/06/20 0630)  Complications: No complications documented.

## 2020-09-06 NOTE — Addendum Note (Signed)
Addendum  created 09/06/20 0958 by Ronnette Hila, CRNA   Charge Capture section accepted

## 2020-09-06 NOTE — Op Note (Signed)

## 2020-09-06 NOTE — Anesthesia Procedure Notes (Signed)
Procedure Name: Intubation Date/Time: 09/06/2020 7:33 AM Performed by: Ronnette Hila, CRNA Pre-anesthesia Checklist: Patient identified, Emergency Drugs available, Suction available and Patient being monitored Patient Re-evaluated:Patient Re-evaluated prior to induction Oxygen Delivery Method: Circle system utilized Preoxygenation: Pre-oxygenation with 100% oxygen Induction Type: IV induction Ventilation: Mask ventilation without difficulty Grade View: Grade II Tube type: Oral Tube size: 7.0 mm Number of attempts: 1 Airway Equipment and Method: Stylet and Oral airway Placement Confirmation: ETT inserted through vocal cords under direct vision,  positive ETCO2 and breath sounds checked- equal and bilateral Secured at: 22 cm Tube secured with: Tape Dental Injury: Teeth and Oropharynx as per pre-operative assessment

## 2020-09-06 NOTE — Discharge Instructions (Signed)
[facility] ______[facility] LAPAROSCOPIC SURGERY: POST OP INSTRUCTIONS Always review your discharge instruction sheet given to you by the facility where your surgery was performed. IF YOU HAVE DISABILITY OR FAMILY LEAVE FORMS, YOU MUST BRING THEM TO THE OFFICE FOR PROCESSING.   DO NOT GIVE THEM TO YOUR DOCTOR.  1. A prescription for pain medication may be given to you upon discharge.  Take your pain medication as prescribed, if needed.  If narcotic pain medicine is not needed, then you may take acetaminophen (Tylenol) or ibuprofen (Advil) as needed. 2. Take your usually prescribed medications unless otherwise directed. 3. If you need a refill on your pain medication, please contact your pharmacy.  They will contact our office to request authorization. Prescriptions will not be filled after 5pm or on week-ends. 4. You should follow a light diet the first few days after arrival home, such as soup and crackers, etc.  Be sure to include lots of fluids daily. 5. Most patients will experience some swelling and bruising in the area of the incisions.  Ice packs will help.  Swelling and bruising can take several days to resolve.  6. It is common to experience some constipation if taking pain medication after surgery.  Increasing fluid intake and taking a stool softener (such as Colace) will usually help or prevent this problem from occurring.  A mild laxative (Milk of Magnesia or Miralax) should be taken according to package instructions if there are no bowel movements after 48 hours. 7. Unless discharge instructions indicate otherwise, you may remove your bandages 24-48 hours after surgery, and you may shower at that time.  You may have steri-strips (small skin tapes) in place directly over the incision.  These strips should be left on the skin for 7-10 days.  If your surgeon used skin glue on the incision, you may shower in 24 hours.  The glue will flake off over the next 2-3 weeks.  Any sutures or  staples will be removed at the office during your follow-up visit. 8. ACTIVITIES:  You may resume regular (light) daily activities beginning the next day--such as daily self-care, walking, climbing stairs--gradually increasing activities as tolerated.  You may have sexual intercourse when it is comfortable.  Refrain from any heavy lifting or straining until approved by your doctor. a. You may drive when you are no longer taking prescription pain medication, you can comfortably wear a seatbelt, and you can safely maneuver your car and apply brakes. b. RETURN TO WORK:  _____1 WEEK IF YOU FEEL OK.  CALL OFFICE FOR ANY PAPAERWORK NEEDED._____________________________________________________ 9. You should see your doctor in the office for a follow-up appointment approximately 2-3 weeks after your surgery.  Make sure that you call for this appointment within a day or two after you arrive home to insure a convenient appointment time. 10. OTHER INSTRUCTIONS:OK TO SHOWER STARTING TOMORROW 11. ICE PACK, TYLENOL, AND IBUPROFEN ALSO FOR PAIN 12. NO LIFTING MORE THAN 15 TO 20 POUNDS FOR 2 WEEKS __________________________________________________________________________________________________________________________ __________________________________________________________________________________________________________________________ WHEN TO CALL YOUR DOCTOR: 1. Fever over 101.0 2. Inability to urinate 3. Continued bleeding from incision. 4. Increased pain, redness, or drainage from the incision. 5. Increasing abdominal pain  The clinic staff is available to answer your questions during regular business hours.  Please don't hesitate to call and ask to speak to one of the nurses for clinical concerns.  If you have a medical emergency, go to the nearest emergency room or call 911.  A Careers adviser from [facility] is always on call at the hospital. [address], [place], [place]  [address]  ? [address] [address], [place], [place]   [address] [phone] ? [phone] ? FAX [phone] Web site: [email or web address]    No Tylenol before 3:00pm if needed. No Oxycodone until 3:30 pm if needed.   Post Anesthesia Home Care Instructions  Activity: Get plenty of rest for the remainder of the day. A responsible individual must stay with you for 24 hours following the procedure.  For the next 24 hours, DO NOT: -Drive a car -Advertising copywriter -Drink alcoholic beverages -Take any medication unless instructed by your physician -Make any legal decisions or sign important papers.  Meals: Start with liquid foods such as gelatin or soup. Progress to regular foods as tolerated. Avoid greasy, spicy, heavy foods. If nausea and/or vomiting occur, drink only clear liquids until the nausea and/or vomiting subsides. Call your physician if vomiting continues.  Special Instructions/Symptoms: Your throat may feel dry or sore from the anesthesia or the breathing tube placed in your throat during surgery. If this causes discomfort, gargle with warm salt water. The discomfort should disappear within 24 hours.  If you had a scopolamine patch placed behind your ear for the management of post- operative nausea and/or vomiting:  1. The medication in the patch is effective for 72 hours, after which it should be removed.  Wrap patch in a tissue and discard in the trash. Wash hands thoroughly with soap and water. 2. You may remove the patch earlier than 72 hours if you experience unpleasant side effects which may include dry mouth, dizziness or visual disturbances. 3. Avoid touching the patch. Wash your hands with soap and water after contact with the patch.

## 2020-09-06 NOTE — Interval H&P Note (Signed)
History and Physical Interval Note:no change in H and P  09/06/2020 7:11 AM  Antonio Jackson  has presented today for surgery, with the diagnosis of SYMPTOMATIC CHOLELITHIASIS.  The various methods of treatment have been discussed with the patient and family. After consideration of risks, benefits and other options for treatment, the patient has consented to  Procedure(s): LAPAROSCOPIC CHOLECYSTECTOMY (N/A) as a surgical intervention.  [name] history has been reviewed, patient examined, no change in status, stable for surgery.  I have reviewed the patient's chart and labs.  Questions were answered to the patient's satisfaction.     Abigail Miyamoto

## 2020-09-06 NOTE — Anesthesia Postprocedure Evaluation (Signed)
Anesthesia Post Note  Patient: Antonio Jackson  Procedure(s) Performed: LAPAROSCOPIC CHOLECYSTECTOMY (N/A Abdomen)     Patient location during evaluation: PACU Anesthesia Type: General Level of consciousness: awake and alert Pain management: pain level controlled Vital Signs Assessment: post-procedure vital signs reviewed and stable Respiratory status: spontaneous breathing, nonlabored ventilation, respiratory function stable and patient connected to nasal cannula oxygen Cardiovascular status: blood pressure returned to baseline and stable Postop Assessment: no apparent nausea or vomiting Anesthetic complications: no   No complications documented.  Last Vitals:  Vitals:   09/06/20 0836 09/06/20 0845  BP:  117/71  Pulse: 86 81  Resp: 10 12  Temp:    SpO2: 100% 96%    Last Pain:  Vitals:   09/06/20 0845  PainSc: 4                  [place] Jr

## 2020-09-07 ENCOUNTER — Encounter (HOSPITAL_BASED_OUTPATIENT_CLINIC_OR_DEPARTMENT_OTHER): Payer: Self-pay | Admitting: Surgery

## 2020-09-07 LAB — SURGICAL PATHOLOGY

## 2021-01-25 ENCOUNTER — Other Ambulatory Visit: Payer: Self-pay

## 2021-01-25 ENCOUNTER — Ambulatory Visit: Payer: 59 | Admitting: Cardiology

## 2021-01-25 ENCOUNTER — Encounter: Payer: Self-pay | Admitting: Cardiology

## 2021-01-25 VITALS — BP 133/88 | HR 90 | Resp 16 | Ht 62.0 in | Wt 162.0 lb

## 2021-01-25 DIAGNOSIS — E7800 Pure hypercholesterolemia, unspecified: Secondary | ICD-10-CM

## 2021-01-25 DIAGNOSIS — E119 Type 2 diabetes mellitus without complications: Secondary | ICD-10-CM

## 2021-01-25 DIAGNOSIS — I4510 Unspecified right bundle-branch block: Secondary | ICD-10-CM

## 2021-01-25 MED ORDER — ROSUVASTATIN CALCIUM 10 MG PO TABS: 10.0000 mg | ORAL_TABLET | Freq: Every day | ORAL | 2 refills | Status: DC

## 2021-01-25 MED ORDER — LOSARTAN POTASSIUM 50 MG PO TABS
50.0000 mg | ORAL_TABLET | Freq: Every evening | ORAL | 2 refills | Status: DC
Start: 2021-01-25 — End: 2021-03-01

## 2021-01-25 NOTE — Progress Notes (Signed)
Primary Physician/Referring:  Ralene Ok, MD  Patient ID: Antonio Jackson, male    DOB: 05-13-1956, 65 y.o.   MRN: [id number]  Chief Complaint  Patient presents with  . Right bundle branch block   . New Patient (Initial Visit)   HPI:    Real Antonio Jackson  is a 65 y.o. renal stones, diabetes mellitus, hyperlipidemia referred to me for new onset right bundle branch block.  Patient is active and does routine activities without any limitations, denies any chest pain or shortness of breath or palpitations or syncope.  Due to cardiovascular risks referred for further cardiac restratification.  Patient has discontinued taking atorvastatin due to headache.  Past Medical History:  Diagnosis Date  . Diabetes mellitus without complication (HCC)   . History of kidney stones    Past Surgical History:  Procedure Laterality Date  . APPENDECTOMY    . CHOLECYSTECTOMY N/A 09/06/2020   Procedure: LAPAROSCOPIC CHOLECYSTECTOMY;  Surgeon: Abigail Miyamoto, MD;  Location: [facility];  Service: General;  Laterality: N/A;  . COLONOSCOPY     Family History  Problem Relation Age of Onset  . Stroke Mother   . Hypertension Mother   . Alzheimer's disease Father     Social History   Tobacco Use  . Smoking status: Never Smoker  . Smokeless tobacco: Never Used  Substance Use Topics  . Alcohol use: No    Comment: occasionally   Marital Status: Married  ROS  Review of Systems  Cardiovascular: Negative for chest pain, dyspnea on exertion and leg swelling.  Gastrointestinal: Negative for melena.   Objective  Blood pressure 133/88, pulse 90, resp. rate 16, height 5\' 2"  (1.575 m), weight 162 lb (73.5 kg), SpO2 98 %. Body mass index is 29.63 kg/m.  Vitals with BMI 01/25/2021 09/06/2020 09/06/2020  Height 5\' 2"  - -  Weight 162 lbs - -  BMI 29.62 - -  Systolic 133 121 11/04/2020  Diastolic 88 84 82  Pulse 90 82 79     Physical Exam Constitutional:      Appearance: Normal appearance.  Neck:      Vascular: No carotid bruit or JVD.  Cardiovascular:     Rate and Rhythm: Normal rate and regular rhythm.     Pulses: Intact distal pulses.     Heart sounds: Normal heart sounds. No murmur heard. No gallop.   Pulmonary:     Effort: Pulmonary effort is normal.     Breath sounds: Normal breath sounds.  Abdominal:     General: Bowel sounds are normal.     Palpations: Abdomen is soft.  Musculoskeletal:        General: No swelling.      Laboratory examination:   Recent Labs    09/03/20 0900  NA 137  K 3.8  CL 103  CO2 24  GLUCOSE 161*  BUN 8  CREATININE 0.69  CALCIUM 9.2  GFRNONAA >60   CrCl cannot be calculated (Patient's most recent lab result is older than the maximum 21 days allowed.).  CMP Latest Ref Rng & Units 09/03/2020 09/06/2012  Glucose 70 - 99 mg/dL 11/01/2020) 11/04/2012)  BUN 8 - 23 mg/dL 8 12  Creatinine 062(I - 1.24 mg/dL 948(N 4.62  Sodium 7.03 - 145 mmol/L 137 140  Potassium 3.5 - 5.1 mmol/L 3.8 4.4  Chloride 98 - 111 mmol/L 103 102  CO2 22 - 32 mmol/L 24 -  Calcium 8.9 - 10.3 mg/dL 9.2 -   CBC Latest Ref Rng & Units  09/06/2012  Hemoglobin 13.0 - 17.0 g/dL 18.0(H)  Hematocrit 39.0 - 52.0 % 53.0(H)    Lipid Panel No results for input(s): CHOL, TRIG, LDLCALC, VLDL, HDL, CHOLHDL, LDLDIRECT in the last 8760 hours. Lipid Panel  No results found for: CHOL, TRIG, HDL, CHOLHDL, VLDL, LDLCALC, LDLDIRECT, LABVLDL   HEMOGLOBIN A1C No results found for: HGBA1C, MPG TSH No results for input(s): TSH in the last 8760 hours.  External labs:   Pended for now  Medications and allergies   Allergies  Allergen Reactions  . Atorvastatin Other (See Comments)    Headache  . Adhesive [Tape] Rash      Medication prior to this encounter:   Outpatient Medications Prior to Visit  Medication Sig Dispense Refill  . esomeprazole (NEXIUM) 40 MG capsule Take 40 mg by mouth daily as needed. For acid reflux    . metFORMIN (GLUCOPHAGE) 500 MG tablet Take 1 tablet by mouth in the  morning and at bedtime.    Marland Kitchen atorvastatin (LIPITOR) 20 MG tablet     . Dulaglutide (TRULICITY [place]) Inject into the skin once a week.    . Dulaglutide (TRULICITY) 0.75 MG/0.5ML SOPN Inject into the skin.    Marland Kitchen HYDROcodone-acetaminophen (NORCO) 10-325 MG per tablet Take 1 tablet by mouth every 6 (six) hours as needed. For pain    . JENTADUETO 2.5-500 MG TABS     . ondansetron (ZOFRAN) 8 MG tablet Take 1 tablet (8 mg total) by mouth every 8 (eight) hours as needed for nausea. 20 tablet 0  . oxyCODONE (OXY IR/ROXICODONE) 5 MG immediate release tablet Take 1 tablet (5 mg total) by mouth every 6 (six) hours as needed for moderate pain. 25 tablet 0  . oxyCODONE-acetaminophen (PERCOCET/ROXICET) 5-325 MG per tablet Take 2 tablets by mouth every 4 (four) hours as needed for pain. 20 tablet 0  . Tamsulosin HCl (FLOMAX) 0.4 MG CAPS Take 0.4 mg by mouth daily.    . traMADol (ULTRAM) 50 MG tablet Take 1 tablet (50 mg total) by mouth every 12 (twelve) hours as needed. 40 tablet 0   No facility-administered medications prior to visit.     FINAL MEDICATION AS OF TODAY:   Current Meds  Medication Sig  . esomeprazole (NEXIUM) 40 MG capsule Take 40 mg by mouth daily as needed. For acid reflux  . losartan (COZAAR) 50 MG tablet Take 1 tablet (50 mg total) by mouth at bedtime.  . metFORMIN (GLUCOPHAGE) 500 MG tablet Take 1 tablet by mouth in the morning and at bedtime.  . rosuvastatin (CRESTOR) 10 MG tablet Take 1 tablet (10 mg total) by mouth daily.    Radiology:   No results found.  Cardiac Studies:   None EKG:     EKG 01/25/2021: Normal sinus rhythm at rate of 88 bpm, right axis deviation, left posterior fascicular block.  Right bundle branch block.  No evidence of ischemia.   Consider pulmonary disease.   Assessment     ICD-10-CM   1. Right bundle branch block  I45.10 EKG 12-Lead    losartan (COZAAR) 50 MG tablet    rosuvastatin (CRESTOR) 10 MG tablet    PCV CARDIAC STRESS TEST    PCV  ECHOCARDIOGRAM COMPLETE  2. Type 2 diabetes mellitus without complication, without long-term current use of insulin (HCC)  E11.9 losartan (COZAAR) 50 MG tablet    CT CARDIAC SCORING ([facility] LOCATIONS ONLY)    Basic metabolic panel  3. Hypercholesteremia  E78.00 rosuvastatin (CRESTOR) 10 MG tablet  Lipid Panel With LDL/HDL Ratio     Medications Discontinued During This Encounter  Medication Reason  . atorvastatin (LIPITOR) 20 MG tablet Side effect (s)  . Dulaglutide (TRULICITY [place]) Not covered by the pt's insurance  . HYDROcodone-acetaminophen (NORCO) 10-325 MG per tablet No longer needed (for PRN medications)  . JENTADUETO 2.5-500 MG TABS Not covered by the pt's insurance  . ondansetron (ZOFRAN) 8 MG tablet No longer needed (for PRN medications)  . oxyCODONE (OXY IR/ROXICODONE) 5 MG immediate release tablet Error  . oxyCODONE-acetaminophen (PERCOCET/ROXICET) 5-325 MG per tablet Error  . Tamsulosin HCl (FLOMAX) 0.4 MG CAPS No longer needed (for PRN medications)  . traMADol (ULTRAM) 50 MG tablet No longer needed (for PRN medications)  . Dulaglutide (TRULICITY) 0.75 MG/0.5ML SOPN Error  . atorvastatin (LIPITOR) 20 MG tablet Side effect (s)  . Dulaglutide (TRULICITY [place]) Not covered by the pt's insurance  . HYDROcodone-acetaminophen (NORCO) 10-325 MG per tablet No longer needed (for PRN medications)  . JENTADUETO 2.5-500 MG TABS Not covered by the pt's insurance  . ondansetron (ZOFRAN) 8 MG tablet No longer needed (for PRN medications)  . traMADol (ULTRAM) 50 MG tablet No longer needed (for PRN medications)  . Tamsulosin HCl (FLOMAX) 0.4 MG CAPS No longer needed (for PRN medications)    Meds ordered this encounter  Medications  . losartan (COZAAR) 50 MG tablet    Sig: Take 1 tablet (50 mg total) by mouth at bedtime.    Dispense:  30 tablet    Refill:  2  . rosuvastatin (CRESTOR) 10 MG tablet    Sig: Take 1 tablet (10 mg total) by mouth daily.    Dispense:  30 tablet    Refill:  2    Orders Placed This Encounter  Procedures  . CT CARDIAC SCORING ([facility] LOCATIONS ONLY)    Standing Status:   Future    Standing Expiration Date:   03/27/2021    Order Specific Question:   Preferred imaging location?    Answer:   [facility]  . Lipid Panel With LDL/HDL Ratio    Standing Status:   Future    Standing Expiration Date:   01/25/2022  . Basic metabolic panel    Standing Status:   Future    Standing Expiration Date:   01/25/2022  . PCV CARDIAC STRESS TEST    Standing Status:   Future    Standing Expiration Date:   03/27/2021  . EKG 12-Lead  . PCV ECHOCARDIOGRAM COMPLETE    Standing Status:   Future    Standing Expiration Date:   01/27/2022   Recommendations:   Sonda Primesibar Wilkerson is a 65 y.o. renal stones, diabetes mellitus, hyperlipidemia referred to me for new onset right bundle branch block.  Patient is active and does routine activities without any limitations, denies any chest pain or shortness of breath or palpitations or syncope.  Due to cardiovascular risks referred for further cardiac restratification.  Patient remains asymptomatic, however in the office diabetes is a risk factor along with male aged >55 years, will obtain lipid profile testing after starting statins, he could not tolerate Lipitor due to headache and we will try Crestor 10 mg.  His baseline labs are available and will be updated later, discussed with Dr. Ludwig ClarksMoreira.  Will obtain coronary calcium score, echocardiogram and a routine treadmill exercise stress test and I will see him back after the test and make further recommendations.  In view of diabetic state, I have started him on losartan 50 mg in  the evening, BMP will be obtained as well.    Yates Decamp, MD, [facility] 01/27/2021, 9:32 AM Office: [phone]

## 2021-01-30 NOTE — Progress Notes (Signed)
Labs 12/25/2018:  Hb 15.6/HCT 46.9, platelets 290.  Sodium 137, potassium 4.6, BUN 10, creatinine 0.52.  eGFR >60 mL.  CMP otherwise normal.  A1c 7.5%.  Total cholesterol 207, triglycerides 289, HDL 46, LDL 103.  Non-HDL cholesterol 161.

## 2021-02-04 ENCOUNTER — Other Ambulatory Visit: Payer: Self-pay | Admitting: Cardiology

## 2021-02-05 ENCOUNTER — Ambulatory Visit: Payer: 59

## 2021-02-05 ENCOUNTER — Other Ambulatory Visit: Payer: Self-pay

## 2021-02-05 DIAGNOSIS — I4510 Unspecified right bundle-branch block: Secondary | ICD-10-CM

## 2021-02-05 LAB — LIPID PANEL WITH LDL/HDL RATIO
Cholesterol, Total: 209 mg/dL — ABNORMAL HIGH (ref 100–199)
HDL: 49 mg/dL (ref >39)
LDL Chol Calc (NIH): 135 mg/dL — ABNORMAL HIGH (ref 0–99)
LDL/HDL Ratio: 2.8 ratio (ref 0.0–3.6)
Triglycerides: 139 mg/dL (ref 0–149)
VLDL Cholesterol Cal: 25 mg/dL (ref 5–40)

## 2021-02-05 LAB — BASIC METABOLIC PANEL
BUN/Creatinine Ratio: 14 (ref 10–24)
BUN: 10 mg/dL (ref 8–27)
CO2: 27 mmol/L (ref 20–29)
Calcium: 10.1 mg/dL (ref 8.6–10.2)
Chloride: 98 mmol/L (ref 96–106)
Creatinine, Ser: 0.69 mg/dL — ABNORMAL LOW (ref 0.76–1.27)
Glucose: 171 mg/dL — ABNORMAL HIGH (ref 65–99)
Potassium: 4.9 mmol/L (ref 3.5–5.2)
Sodium: 136 mmol/L (ref 134–144)
eGFR: 103 mL/min/{1.73_m2} (ref >59)

## 2021-02-10 NOTE — Progress Notes (Signed)
Echocardiogram 02/05/2021: Normal LV systolic function with visual EF 60-65%. Left ventricle cavity is normal in size. Normal left ventricular wall thickness. Normal global wall motion. Normal diastolic filling pattern, normal LAP. No significant valvular heart disease. No prior study for comparison.

## 2021-02-18 ENCOUNTER — Other Ambulatory Visit: Payer: Self-pay

## 2021-02-19 ENCOUNTER — Other Ambulatory Visit: Payer: Self-pay

## 2021-02-20 ENCOUNTER — Other Ambulatory Visit: Payer: Self-pay

## 2021-02-21 ENCOUNTER — Other Ambulatory Visit: Payer: Self-pay

## 2021-02-22 ENCOUNTER — Ambulatory Visit
Admission: RE | Admit: 2021-02-22 | Discharge: 2021-02-22 | Disposition: A | Discharge status: Home / Self Care | Payer: 59 | Source: Ambulatory Visit | Attending: Cardiology | Admitting: Cardiology

## 2021-02-22 ENCOUNTER — Other Ambulatory Visit: Payer: Self-pay

## 2021-02-22 DIAGNOSIS — E119 Type 2 diabetes mellitus without complications: Secondary | ICD-10-CM

## 2021-02-25 ENCOUNTER — Telehealth: Payer: Self-pay | Admitting: Cardiology

## 2021-02-25 NOTE — Telephone Encounter (Signed)
Labs 12/25/2020:  Total cholesterol 207, triglycerides 289, HDL 46, LDL 103.  Hb 15.6/HCT 46.9, platelets 290, normal indicis.  Sodium 137, potassium 4.6, serum glucose 194, BUN 10, creatinine 0.52.  CMP otherwise normal.  eGFR 130/112 mL.  A1c 7.5%.

## 2021-02-28 NOTE — Progress Notes (Signed)
Coronary calcium score 02/23/2021: Total Agatston score 24.7.  MESA database percentile 71%.  Normal aortic measurements.  Extracardiac structures normal limits.

## 2021-03-01 ENCOUNTER — Encounter: Payer: Self-pay | Admitting: Cardiology

## 2021-03-01 ENCOUNTER — Other Ambulatory Visit: Payer: Self-pay

## 2021-03-01 ENCOUNTER — Ambulatory Visit: Payer: 59 | Admitting: Cardiology

## 2021-03-01 VITALS — BP 134/83 | HR 77 | Temp 98.3°F | Resp 17 | Ht 62.0 in | Wt 160.4 lb

## 2021-03-01 DIAGNOSIS — E119 Type 2 diabetes mellitus without complications: Secondary | ICD-10-CM

## 2021-03-01 DIAGNOSIS — R931 Abnormal findings on diagnostic imaging of heart and coronary circulation: Secondary | ICD-10-CM

## 2021-03-01 DIAGNOSIS — E7800 Pure hypercholesterolemia, unspecified: Secondary | ICD-10-CM

## 2021-03-01 DIAGNOSIS — I4510 Unspecified right bundle-branch block: Secondary | ICD-10-CM

## 2021-03-01 MED ORDER — ASPIRIN 81 MG PO CHEW: 81.0000 mg | CHEWABLE_TABLET | Freq: Every day | ORAL | Status: AC

## 2021-03-01 MED ORDER — LOSARTAN POTASSIUM 50 MG PO TABS: 25.0000 mg | ORAL_TABLET | Freq: Every evening | ORAL | 2 refills | Status: DC

## 2021-03-01 NOTE — Progress Notes (Signed)
Primary Physician/Referring:  Ralene Ok, MD  Patient ID: Antonio Jackson, male    DOB: 11/18/1955, 65 y.o.   MRN: [id number]  Chief Complaint  Patient presents with   Hyperlipidemia   Follow-up   RIGHT BUNDLE BRANCH BLOCK    HPI:    Antonio Jackson  is a 65 y.o. renal stones, diabetes mellitus, hyperlipidemia referred to me for new onset right bundle branch block.  Patient is active and does routine activities without any limitations, denies any chest pain or shortness of breath or palpitations or syncope.  Due to cardiovascular risks referred for further cardiac risk stratification.  I had seen him about 6 to 8 weeks ago, he is still not had a treadmill stress test which has been scheduled for next month due to scheduling issues.  He has no chest pain or dyspnea and is now planning to move to [place] to be closer to his daughters.  He has not started Lipitor yet and also he has not started losartan.  Today he is accompanied by his wife.  He has been extremely busy as he was doing heavy activity around the house trying to get the house ready to sell.  Past Medical History:  Diagnosis Date   Diabetes mellitus without complication (HCC)    History of kidney stones    Past Surgical History:  Procedure Laterality Date   APPENDECTOMY     CHOLECYSTECTOMY N/A 09/06/2020   Procedure: LAPAROSCOPIC CHOLECYSTECTOMY;  Surgeon: Abigail Miyamoto, MD;  [facility]:  [facility];  Service: General;  Laterality: N/A;   COLONOSCOPY     Family History  Problem Relation Age of Onset   Stroke Mother    Hypertension Mother    Alzheimer's disease Father     Social History   Tobacco Use   Smoking status: Never   Smokeless tobacco: Never  Substance Use Topics   Alcohol use: No    Comment: occasionally   Marital Status: Married  ROS  Review of Systems  Cardiovascular:  Negative for chest pain, dyspnea on exertion and leg swelling.  Gastrointestinal:  Negative for melena.   Objective  Blood pressure 134/83, pulse 77, temperature 98.3 F (36.8 C), temperature source Temporal, resp. rate 17, height 5\' 2"  (1.575 m), weight 160 lb 6.4 oz (72.8 kg), SpO2 98 %. Body mass index is 29.34 kg/m.   Vitals with BMI 03/01/2021 01/25/2021 09/06/2020  Height 5\' 2"  5\' 2"  -  Weight 160 lbs 6 oz 162 lbs -  BMI 29.33 29.62 -  Systolic 134 133 11/04/2020  Diastolic 83 88 84  Pulse 77 90 82     Physical Exam Constitutional:      Appearance: Normal appearance.  Neck:     Vascular: No carotid bruit or JVD.  Cardiovascular:     Rate and Rhythm: Normal rate and regular rhythm.     Pulses: Intact distal pulses.     Heart sounds: Normal heart sounds. No murmur heard.   No gallop.  Pulmonary:     Effort: Pulmonary effort is normal.     Breath sounds: Normal breath sounds.  Abdominal:     General: Bowel sounds are normal.     Palpations: Abdomen is soft.  Musculoskeletal:        General: No swelling.     Laboratory examination:   Recent Labs    09/03/20 0900 02/04/21 1033  NA 137 136  K 3.8 4.9  CL 103 98  CO2 24 27  GLUCOSE 161* 171*  BUN 8 10  CREATININE 0.69 0.69*  CALCIUM 9.2 10.1  GFRNONAA >60  --    CrCl cannot be calculated (Patient's most recent lab result is older than the maximum 21 days allowed.).  CMP Latest Ref Rng & Units 02/04/2021 09/03/2020 09/06/2012  Glucose 65 - 99 mg/dL 171(H) 161(H) 203(H)  BUN 8 - 27 mg/dL $Remove'10 8 12  'lkLAtnj$ Creatinine 0.76 - 1.27 mg/dL 0.69(L) 0.69 0.80  Sodium 134 - 144 mmol/L 136 137 140  Potassium 3.5 - 5.2 mmol/L 4.9 3.8 4.4  Chloride 96 - 106 mmol/L 98 103 102  CO2 20 - 29 mmol/L 27 24 -  Calcium 8.6 - 10.2 mg/dL 10.1 9.2 -   CBC Latest Ref Rng & Units 09/06/2012  Hemoglobin 13.0 - 17.0 g/dL 18.0(H)  Hematocrit 39.0 - 52.0 % 53.0(H)    Lipid Panel Recent Labs    02/04/21 1033  CHOL 209*  TRIG 139  LDLCALC 135*  HDL 49   Lipid Panel     Component Value Date/Time   CHOL 209 (H) 02/04/2021 1033   TRIG 139 02/04/2021 1033    HDL 49 02/04/2021 1033   LDLCALC 135 (H) 02/04/2021 1033   LABVLDL 25 02/04/2021 1033     HEMOGLOBIN A1C No results found for: HGBA1C, MPG TSH No results for input(s): TSH in the last 8760 hours.  External labs:   Labs 12/25/2020:  Total cholesterol 207, triglycerides 289, HDL 46, LDL 103.  Hb 15.6/HCT 46.9, platelets 290, normal indicis.  Sodium 137, potassium 4.6, serum glucose 194, BUN 10, creatinine 0.52.  CMP otherwise normal.  eGFR 130/112 mL.  A1c 7.5%.  Medications and allergies   Allergies  Allergen Reactions   Atorvastatin Other (See Comments)    Headache   Adhesive [Tape] Rash   Sulfa Antibiotics Rash    Medication prior to this encounter:   Outpatient Medications Prior to Visit  Medication Sig Dispense Refill   esomeprazole (NEXIUM) 40 MG capsule Take 40 mg by mouth daily as needed. For acid reflux     metFORMIN (GLUCOPHAGE) 500 MG tablet Take 1 tablet by mouth in the morning and at bedtime.     rosuvastatin (CRESTOR) 10 MG tablet Take 1 tablet (10 mg total) by mouth daily. 30 tablet 2   TRULICITY 1.5 SA/6.3KZ SOPN Inject 1.5 mg into the skin once a week.     losartan (COZAAR) 50 MG tablet Take 1 tablet (50 mg total) by mouth at bedtime. 30 tablet 2   No facility-administered medications prior to visit.    FINAL MEDICATION AS OF TODAY:   Current Meds  Medication Sig   aspirin (ASPIRIN CHILDRENS) 81 MG chewable tablet Chew 1 tablet (81 mg total) by mouth daily.   esomeprazole (NEXIUM) 40 MG capsule Take 40 mg by mouth daily as needed. For acid reflux   metFORMIN (GLUCOPHAGE) 500 MG tablet Take 1 tablet by mouth in the morning and at bedtime.   rosuvastatin (CRESTOR) 10 MG tablet Take 1 tablet (10 mg total) by mouth daily.   TRULICITY 1.5 SW/1.0XN SOPN Inject 1.5 mg into the skin once a week.   [DISCONTINUED] losartan (COZAAR) 50 MG tablet Take 1 tablet (50 mg total) by mouth at bedtime.    Radiology:   No results found.  Cardiac Studies:    Echocardiogram 02/05/2021: Normal LV systolic function with visual EF 60-65%. Left ventricle cavity is normal in size. Normal left ventricular wall thickness. Normal global wall motion. Normal diastolic filling pattern, normal LAP. No  significant valvular heart disease. No prior study for comparison.  Coronary calcium score 02/23/2021: Total Agatston score 24.7.  MESA database percentile 71%.  Normal aorticmeasurements.  Extracardiac structures normal limits.  EKG:   EKG 01/25/2021: Normal sinus rhythm at rate of 88 bpm, right axis deviation, left posterior fascicular block.  Right bundle branch block.  No evidence of ischemia.   Consider pulmonary disease.   Assessment     ICD-10-CM   1. Right bundle branch block  I45.10     2. Type 2 diabetes mellitus without complication, without long-term current use of insulin (HCC)  E11.9 losartan (COZAAR) 50 MG tablet    3. Hypercholesteremia  E78.00     4. Agatston CAC score, <100 MESA data base 67%. Score 24.7. 02/23/21  R93.1 aspirin (ASPIRIN CHILDRENS) 81 MG chewable tablet       Medications Discontinued During This Encounter  Medication Reason   losartan (COZAAR) 50 MG tablet      Meds ordered this encounter  Medications   losartan (COZAAR) 50 MG tablet    Sig: Take 0.5 tablets (25 mg total) by mouth at bedtime.    Dispense:  30 tablet    Refill:  2   aspirin (ASPIRIN CHILDRENS) 81 MG chewable tablet    Sig: Chew 1 tablet (81 mg total) by mouth daily.    No orders of the defined types were placed in this encounter.  Recommendations:   Harlan Ervine is a 65 y.o. renal stones, diabetes mellitus, hyperlipidemia referred to me for new onset right bundle branch block.  Patient is active and does routine activities without any limitations, denies any chest pain or shortness of breath or palpitations or syncope.  Due to cardiovascular risks referred for further cardiac restratification.  I had seen him about 6 to 8 weeks ago, he  is still not had a treadmill stress test which has been scheduled for next month due to scheduling issues.  He has no chest pain or dyspnea and is now planning to move to [place] to be closer to his daughters.  I reviewed the results of the lipid panel, no change in lipids however patient has not started Lipitor.  I discussed the findings of the coronary calcium score, places him at intermediate risk and hence in view of underlying diabetes mellitus, aspirin 81 mg and resuming statins is most appropriate for primary prevention.  Echocardiogram is unremarkable.  He is scheduled for treadmill stress test in July 2022, unless this is abnormal, I will see him back on a as needed basis.  With regard to use of losartan, although blood pressure is normal, advised him that it is vascular protection and also in view of diabetes probably should be on at least a low-dose of ARB, advised him to try 25 mg in the evening.  His wife is present at the bedside and all questions answered.  This was a 40-minute office visit encounter.   Adrian Prows, MD, [facility] 03/01/2021, 1:13 PM Office: [phone]

## 2021-03-08 ENCOUNTER — Ambulatory Visit: Payer: 59 | Admitting: Cardiology

## 2021-04-23 ENCOUNTER — Other Ambulatory Visit: Payer: Self-pay | Admitting: Cardiology

## 2021-04-23 DIAGNOSIS — E7800 Pure hypercholesterolemia, unspecified: Secondary | ICD-10-CM

## 2021-04-23 DIAGNOSIS — E119 Type 2 diabetes mellitus without complications: Secondary | ICD-10-CM

## 2021-04-23 DIAGNOSIS — I4510 Unspecified right bundle-branch block: Secondary | ICD-10-CM

## 2021-06-14 ENCOUNTER — Ambulatory Visit: Payer: 59 | Admitting: Orthopaedic Surgery

## 2021-06-14 ENCOUNTER — Ambulatory Visit: Payer: Self-pay

## 2021-06-14 ENCOUNTER — Other Ambulatory Visit: Payer: Self-pay

## 2021-06-14 ENCOUNTER — Encounter: Payer: Self-pay | Admitting: Orthopaedic Surgery

## 2021-06-14 DIAGNOSIS — G8929 Other chronic pain: Secondary | ICD-10-CM | POA: Diagnosis not present

## 2021-06-14 DIAGNOSIS — M542 Cervicalgia: Secondary | ICD-10-CM

## 2021-06-14 DIAGNOSIS — M25512 Pain in left shoulder: Secondary | ICD-10-CM

## 2021-06-14 NOTE — Progress Notes (Signed)
Office Visit Note   Patient: Antonio Jackson           Date of Birth: [birth year]           MRN: [id number] Visit Date: 06/14/2021              Requested by: Ralene Ok, MD [address] [place],  [place] [address] PCP: Ralene Ok, MD   Assessment & Plan: Visit Diagnoses:  1. Chronic left shoulder pain   2. 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  I believe majority of his symptoms are actually coming from the subacromial space and I would like to inject this with cortisone today.  I have provided him with a hardcopy prescription to bring to the current therapy location where he is getting acupuncture for them to start working on his left shoulder.  We have also discussed left shoulder intra-articular cortisone injection if the subacromial injection fails to relieve his symptoms.  He will follow-up with [place] as needed.  Follow-Up Instructions: Return if symptoms worsen or fail to improve.   Orders:  Orders Placed This Encounter  Procedures   XR Cervical Spine 2 or 3 views   XR Shoulder Left   No orders of the defined types were placed in this encounter.     Procedures: Large Joint Inj: L subacromial bursa on 06/14/2021 10:20 AM Indications: pain Details: 22 G needle Medications: 2 mL lidocaine 2 %; 2 mL bupivacaine 0.25 %; 40 mg methylPREDNISolone acetate 40 MG/ML Outcome: tolerated well, no immediate complications Patient was prepped and draped in the usual sterile fashion.      Clinical Data: No additional findings.   Subjective: Chief Complaint  Patient presents with   Left Shoulder - Pain   Neck - Pain    HPI patient is a pleasant 65 year old gentleman who comes in today with left shoulder pain for the past 3 to 4 months.  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  The pain he has been having is primarily to the deltoid  and occasionally the top of the shoulder.  Shoulder abduction, forward flexion internal rotation aggravates his symptoms.  He has been taking ibuprofen without significant relief of symptoms.  He does note occasional numbness and tingling to the small and ring fingers of the left hand.  No previous history of left shoulder pathology, but he does note history of right frozen shoulder for which she is currently getting acupuncture.  Review of Systems as detailed in HPI.  All others reviewed and are negative.   Objective: Vital Signs: There were no vitals taken for this visit.  Physical Exam well-developed well-nourished gentleman in no acute distress.  Alert and oriented x3.  Ortho Exam examination of the left shoulder reveals forward flexion to about 150 degrees.  Internal rotation to his back pocket.  Abduction to about 80 degrees.  Pain with empty can testing.  5 out of 5 strength throughout.  He is neurovascular intact distally.  Specialty Comments:  No specialty comments available.  Imaging: XR Cervical Spine 2 or 3 views  Result Date: 06/14/2021 X-rays demonstrate moderate degenerative changes C4-5 and C5-6  XR Shoulder Left  Result Date: 06/14/2021 No new imaging    PMFS History: There are no problems to display for this patient.  Past Medical History:  Diagnosis Date   Diabetes mellitus  without complication (HCC)    History of kidney stones     Family History  Problem Relation Age of Onset   Stroke Mother    Hypertension Mother    Alzheimer's disease Father     Past Surgical History:  Procedure Laterality Date   APPENDECTOMY     CHOLECYSTECTOMY N/A 09/06/2020   Procedure: LAPAROSCOPIC CHOLECYSTECTOMY;  Surgeon: Abigail Miyamoto, MD;  Location: [facility];  Service: General;  Laterality: N/A;   COLONOSCOPY     Social History   Occupational History   Not on file  Tobacco Use   Smoking status: Never   Smokeless tobacco: Never  Vaping Use    Vaping Use: Never used  Substance and Sexual Activity   Alcohol use: No    Comment: occasionally   Drug use: Never   Sexual activity: Not on file

## 2021-06-15 MED ORDER — LIDOCAINE HCL 2 % IJ SOLN
2.0000 mL | INTRAMUSCULAR | Status: AC | PRN
  Administered 2021-06-14: 2 mL

## 2021-06-15 MED ORDER — BUPIVACAINE HCL 0.25 % IJ SOLN
2.0000 mL | INTRAMUSCULAR | Status: AC | PRN
  Administered 2021-06-14: 2 mL via INTRA_ARTICULAR

## 2021-06-15 MED ORDER — METHYLPREDNISOLONE ACETATE 40 MG/ML IJ SUSP
40.0000 mg | INTRAMUSCULAR | Status: AC | PRN
  Administered 2021-06-14: 40 mg via INTRA_ARTICULAR
